# Patient Record
Sex: Female | Born: 1985 | Race: White | Hispanic: No | Marital: Single | Smoking: Current every day smoker
Health system: Southern US, Community
[De-identification: ages and names within clinical notes are randomized; demographics above are authoritative.]

## PROBLEM LIST (undated history)

## (undated) DIAGNOSIS — J45909 Unspecified asthma, uncomplicated: Secondary | ICD-10-CM

## (undated) HISTORY — PX: CHOLECYSTECTOMY: SHX55

## (undated) HISTORY — PX: HERNIA REPAIR: SHX51

---
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
## 2012-09-30 ENCOUNTER — Encounter (HOSPITAL_BASED_OUTPATIENT_CLINIC_OR_DEPARTMENT_OTHER): Payer: Self-pay | Admitting: *Deleted

## 2012-09-30 ENCOUNTER — Emergency Department (HOSPITAL_BASED_OUTPATIENT_CLINIC_OR_DEPARTMENT_OTHER)
Admission: EM | Admit: 2012-09-30 | Discharge: 2012-09-30 | Disposition: A | Discharge status: Home / Self Care | Payer: 59 | Attending: Emergency Medicine | Admitting: Emergency Medicine

## 2012-09-30 DIAGNOSIS — R059 Cough, unspecified: Secondary | ICD-10-CM | POA: Insufficient documentation

## 2012-09-30 DIAGNOSIS — R0602 Shortness of breath: Secondary | ICD-10-CM | POA: Insufficient documentation

## 2012-09-30 DIAGNOSIS — Z79899 Other long term (current) drug therapy: Secondary | ICD-10-CM | POA: Insufficient documentation

## 2012-09-30 DIAGNOSIS — J45901 Unspecified asthma with (acute) exacerbation: Secondary | ICD-10-CM | POA: Insufficient documentation

## 2012-09-30 DIAGNOSIS — R05 Cough: Secondary | ICD-10-CM | POA: Insufficient documentation

## 2012-09-30 DIAGNOSIS — F17200 Nicotine dependence, unspecified, uncomplicated: Secondary | ICD-10-CM | POA: Insufficient documentation

## 2012-09-30 HISTORY — DX: Unspecified asthma, uncomplicated: J45.909

## 2012-09-30 MED ORDER — IPRATROPIUM BROMIDE 0.02 % IN SOLN
0.5000 mg | Freq: Once | RESPIRATORY_TRACT | Status: AC
  Administered 2012-09-30: 0.5 mg via RESPIRATORY_TRACT
  Filled 2012-09-30: qty 2.5

## 2012-09-30 MED ORDER — ALBUTEROL SULFATE HFA 108 (90 BASE) MCG/ACT IN AERS: 1.0000 | INHALATION_SPRAY | RESPIRATORY_TRACT | Status: DC | PRN

## 2012-09-30 MED ORDER — PREDNISONE 50 MG PO TABS
60.0000 mg | ORAL_TABLET | Freq: Once | ORAL | Status: AC
  Administered 2012-09-30: 60 mg via ORAL
  Filled 2012-09-30: qty 1

## 2012-09-30 MED ORDER — GUAIFENESIN-CODEINE 100-10 MG/5ML PO SYRP: 10.0000 mL | ORAL_SOLUTION | Freq: Three times a day (TID) | ORAL | Status: DC | PRN

## 2012-09-30 MED ORDER — AZITHROMYCIN 250 MG PO TABS
500.0000 mg | ORAL_TABLET | Freq: Once | ORAL | Status: AC
  Administered 2012-09-30: 500 mg via ORAL
  Filled 2012-09-30: qty 2

## 2012-09-30 MED ORDER — PREDNISONE 20 MG PO TABS: ORAL_TABLET | ORAL | Status: DC

## 2012-09-30 MED ORDER — GUAIFENESIN-CODEINE 100-10 MG/5ML PO SOLN
10.0000 mL | Freq: Once | ORAL | Status: AC
  Administered 2012-09-30: 10 mL via ORAL
  Filled 2012-09-30: qty 10

## 2012-09-30 MED ORDER — AZITHROMYCIN 250 MG PO TABS: 250.0000 mg | ORAL_TABLET | Freq: Every day | ORAL | Status: DC

## 2012-09-30 MED ORDER — ALBUTEROL SULFATE (5 MG/ML) 0.5% IN NEBU
5.0000 mg | INHALATION_SOLUTION | Freq: Once | RESPIRATORY_TRACT | Status: AC
  Administered 2012-09-30: 5 mg via RESPIRATORY_TRACT
  Filled 2012-09-30: qty 1

## 2012-09-30 NOTE — ED Provider Notes (Signed)
History     CSN: [id number]  Arrival date & time 09/30/12  1749   First MD Initiated Contact with Patient 09/30/12 1906      Chief Complaint  Patient presents with  . Chest Pain    (Consider location/radiation/quality/duration/timing/severity/associated sxs/prior treatment) Patient is a 27 y.o. female presenting with cough. The history is provided by the patient. No language interpreter was used.  Cough Cough characteristics:  Non-productive Severity:  Moderate Onset quality:  Gradual Duration:  2 days Timing: Frequent hacking cough, associated with wheezing. Progression:  Worsening Chronicity:  New Smoker: yes   Context comment:  Hx asthma, is a smoker. Relieved by:  Nothing Worsened by:  Nothing tried Ineffective treatments:  Beta-agonist inhaler Associated symptoms: chest pain, shortness of breath and wheezing   Associated symptoms: no chills and no fever     Past Medical History  Diagnosis Date  . Asthma     Past Surgical History  Procedure Laterality Date  . Cholecystectomy    . Hernia repair      No family history on file.  History  Substance Use Topics  . Smoking status: Current Every Day Smoker -- 0.50 packs/day    Types: Cigarettes  . Smokeless tobacco: Not on file  . Alcohol Use: No    OB History   Grav Para Term Preterm Abortions TAB SAB Ect Mult Living                  Review of Systems  Constitutional: Negative for fever and chills.  HENT: Negative.   Respiratory: Positive for cough, shortness of breath and wheezing.   Cardiovascular: Positive for chest pain.  Gastrointestinal: Negative.   Genitourinary:       Having menses now.  Musculoskeletal: Negative.   Skin: Negative.   Neurological: Negative.   Psychiatric/Behavioral: Negative.     Allergies  Review of patient's allergies indicates no known allergies.  Home Medications   Current Outpatient Rx  Name  Route  Sig  Dispense  Refill  . ALBUTEROL IN   Inhalation   Inhale  into the lungs.           BP 128/86  Pulse 110  Temp(Src) 98 F (36.7 C) (Oral)  Resp 22  Wt 210 lb (95.255 kg)  SpO2 97%  Physical Exam  Constitutional: She is oriented to person, place, and time. She appears well-developed and well-nourished. No distress.  HENT:  Head: Normocephalic and atraumatic.  Right Ear: External ear normal.  Left Ear: External ear normal.  Mouth/Throat: Oropharynx is clear and moist.  Eyes: Conjunctivae and EOM are normal. Pupils are equal, round, and reactive to light.  Neck: Normal range of motion. Neck supple.  Cardiovascular: Normal rate, regular rhythm and normal heart sounds.   Pulmonary/Chest: Effort normal. Wheezes: Wheezes heard over anterior lung fields.  Abdominal: Soft. Bowel sounds are normal.  Musculoskeletal: Normal range of motion. She exhibits no edema and no tenderness.  Neurological: She is alert and oriented to person, place, and time.  No sensory or motor deficit.  Skin: Skin is warm and dry.  Psychiatric: She has a normal mood and affect. Her behavior is normal.    ED Course  Procedures (including critical care time)  Rx for asthmatic bronchitis with azithromycin, prednisone taper, Robitussin AC, and continued use of albuterol inhaler.  No work for 3 days.    1. Asthmatic bronchitis            Carleene Cooper III, MD 10/01/12 [phone]

## 2012-09-30 NOTE — ED Notes (Signed)
Pt states that she "thinks she might have had an asthma attack this morning". She used her Albuterol inhaler this morning and states she used "40 puffs".

## 2012-09-30 NOTE — ED Provider Notes (Signed)
6:00 PM  Date: 09/30/2012  Rate: 109  Rhythm: sinus tachycardia  QRS Axis: right  Intervals: normal PQRS:  Right atrial enlargement.  Poor R wave progression in precordial leads suggests possible old anterior myocardial infarction.  ST/T Wave abnormalities: normal  Conduction Disutrbances:none  Narrative Interpretation: Abnormal EKG  Old EKG Reviewed: none available    Carleene Cooper III, MD 09/30/12 [phone]

## 2012-09-30 NOTE — ED Notes (Signed)
MD at bedside. 

## 2012-09-30 NOTE — ED Notes (Signed)
Chest pressure in her chest x 2 days. States she has asthma. Sob.

## 2013-06-01 ENCOUNTER — Emergency Department (HOSPITAL_COMMUNITY)
Admission: EM | Admit: 2013-06-01 | Discharge: 2013-06-01 | Disposition: A | Discharge status: Home / Self Care | Payer: Self-pay | Attending: Emergency Medicine | Admitting: Emergency Medicine

## 2013-06-01 ENCOUNTER — Emergency Department (HOSPITAL_COMMUNITY): Payer: Self-pay

## 2013-06-01 ENCOUNTER — Encounter (HOSPITAL_COMMUNITY): Payer: Self-pay | Admitting: Emergency Medicine

## 2013-06-01 DIAGNOSIS — J069 Acute upper respiratory infection, unspecified: Secondary | ICD-10-CM | POA: Insufficient documentation

## 2013-06-01 DIAGNOSIS — H9190 Unspecified hearing loss, unspecified ear: Secondary | ICD-10-CM | POA: Insufficient documentation

## 2013-06-01 DIAGNOSIS — F17200 Nicotine dependence, unspecified, uncomplicated: Secondary | ICD-10-CM | POA: Insufficient documentation

## 2013-06-01 DIAGNOSIS — Z79899 Other long term (current) drug therapy: Secondary | ICD-10-CM | POA: Insufficient documentation

## 2013-06-01 DIAGNOSIS — J45901 Unspecified asthma with (acute) exacerbation: Secondary | ICD-10-CM | POA: Insufficient documentation

## 2013-06-01 DIAGNOSIS — R0602 Shortness of breath: Secondary | ICD-10-CM | POA: Insufficient documentation

## 2013-06-01 MED ORDER — PREDNISONE 20 MG PO TABS
60.0000 mg | ORAL_TABLET | Freq: Once | ORAL | Status: AC
  Administered 2013-06-01: 60 mg via ORAL
  Filled 2013-06-01: qty 3

## 2013-06-01 MED ORDER — ALBUTEROL SULFATE HFA 108 (90 BASE) MCG/ACT IN AERS: 2.0000 | INHALATION_SPRAY | RESPIRATORY_TRACT | Status: DC | PRN

## 2013-06-01 MED ORDER — ALBUTEROL SULFATE HFA 108 (90 BASE) MCG/ACT IN AERS
4.0000 | INHALATION_SPRAY | Freq: Once | RESPIRATORY_TRACT | Status: AC
  Administered 2013-06-01: 4 via RESPIRATORY_TRACT
  Filled 2013-06-01: qty 6.7

## 2013-06-01 MED ORDER — PREDNISONE 20 MG PO TABS: ORAL_TABLET | ORAL | Status: DC

## 2013-06-01 NOTE — ED Notes (Signed)
PT with hx of asthma to ED c/o sob and R ear pain.  States her inhaler is no longer working.

## 2013-06-01 NOTE — ED Provider Notes (Signed)
CSN: [id number]     Arrival date & time 06/01/13  1111 History   First MD Initiated Contact with Patient 06/01/13 1148     Chief Complaint  Patient presents with  . Otalgia  . Shortness of Breath   (Consider location/radiation/quality/duration/timing/severity/associated sxs/prior Treatment) HPI Comments: 27 year old female presents with one week of feeling like her right ear is clogged. She states it feels full she has mildly decreased hearing to that side. It seems to wax and wane and this will be better with it seems to pop when she opens her jaw like with yawning. She's also been having some sinus congestion during this time. No fevers or chills. She has a chronic cough from her asthma and smoking does not worse. She has been having worse than normal wheezing over the past 3 or 4 days. She ran out of her inhaler 4 days ago. She states she uses her inhaler almost daily. She has had some shortness of breath with exertion but is feeling normal at rest.   Past Medical History  Diagnosis Date  . Asthma    Past Surgical History  Procedure Laterality Date  . Cholecystectomy    . Hernia repair     No family history on file. History  Substance Use Topics  . Smoking status: Current Every Day Smoker -- 0.15 packs/day    Types: Cigarettes  . Smokeless tobacco: Not on file  . Alcohol Use: No   OB History   Grav Para Term Preterm Abortions TAB SAB Ect Mult Living                 Review of Systems  Constitutional: Negative for fever and chills.  HENT: Positive for congestion and ear pain. Negative for ear discharge and sore throat.   Respiratory: Positive for cough, shortness of breath and wheezing.   Cardiovascular: Negative for chest pain.  Genitourinary: Negative for dysuria.    Allergies  Review of patient's allergies indicates no known allergies.  Home Medications   Current Outpatient Rx  Name  Route  Sig  Dispense  Refill  . albuterol (PROVENTIL HFA;VENTOLIN HFA) 108 (90  BASE) MCG/ACT inhaler   Inhalation   Inhale 1-2 puffs into the lungs every 4 (four) hours as needed for wheezing.   1 Inhaler   0   . albuterol (PROVENTIL HFA;VENTOLIN HFA) 108 (90 BASE) MCG/ACT inhaler   Inhalation   Inhale 2 puffs into the lungs every 4 (four) hours as needed for wheezing or shortness of breath.   1 Inhaler   0   . ALBUTEROL IN   Inhalation   Inhale into the lungs.         Marland Kitchen azithromycin (ZITHROMAX Z-PAK) 250 MG tablet   Oral   Take 1 tablet (250 mg total) by mouth daily.   4 tablet   0   . guaiFENesin-codeine (ROBITUSSIN AC) 100-10 MG/5ML syrup   Oral   Take 10 mLs by mouth 3 (three) times daily as needed for cough (Take 2 teaspoons every 4 hours if needed for cough.).   120 mL   0   . predniSONE (DELTASONE) 20 MG tablet      Take 3 tablets tomorrow, then take 2 tablets the next 2 days, then take 1 tablet the next 2 days.   9 tablet   0   . predniSONE (DELTASONE) 20 MG tablet      2 tabs po daily x 4 days   8 tablet   0  BP 142/86  Pulse 83  Temp(Src) 97.9 F (36.6 C) (Oral)  Resp 20  SpO2 100%  LMP 05/14/2013 Physical Exam  Nursing note and vitals reviewed. Constitutional: She is oriented to person, place, and time. She appears well-developed and well-nourished.  HENT:  Head: Normocephalic and atraumatic.  Right Ear: Tympanic membrane, external ear and ear canal normal.  Left Ear: Tympanic membrane, external ear and ear canal normal.  Nose: Nose normal.  Mouth/Throat: Oropharynx is clear and moist.  Eyes: Right eye exhibits no discharge. Left eye exhibits no discharge.  Cardiovascular: Normal rate, regular rhythm and normal heart sounds.   Pulmonary/Chest: Effort normal. Not tachypneic. She has no decreased breath sounds. She has wheezes.  Abdominal: Soft. There is no tenderness.  Neurological: She is alert and oriented to person, place, and time.  Skin: Skin is warm and dry.    ED Course  Procedures (including critical care  time) Labs Review Labs Reviewed - No data to display Imaging Review Dg Chest 2 View (if Patient Has Fever And/or Copd)  06/01/2013   CLINICAL DATA:  Short of breath.  Cough.  Smoker.  Asthma.  EXAM: CHEST  2 VIEW  COMPARISON:  02/18/2013  FINDINGS: Midline trachea. Normal heart size and mediastinal contours. No pleural effusion or pneumothorax. Diffuse peribronchial thickening. No lobar consolidation. Probable cholecystectomy.  IMPRESSION: 1.  No acute cardiopulmonary disease. 2. Mild peribronchial thickening which may relate to chronic bronchitis or smoking.   Electronically Signed   By: Jeronimo Greaves M.D.   On: 06/01/2013 11:48    EKG Interpretation   None       MDM   1. Upper respiratory infection   2. Asthma exacerbation    Patient symptoms are consistent with a viral URI. These are likely exacerbating her chronic asthma. I counseled her on stopping smoking. Will give her an inhaler here and they refill as an outpatient. She is in no respiratory distress and has a normal chest x-ray. The ear is likely related to the congestion as there is no sign of a otitis media or externa. We'll also give her a burst of steroids. At this point patient appears well and has no indications for further treatment or assessment in the ER has been given strict return precautions.    Audree Camel, MD 06/01/13 1300

## 2014-02-24 ENCOUNTER — Encounter (HOSPITAL_COMMUNITY): Payer: Self-pay | Admitting: Emergency Medicine

## 2014-02-24 ENCOUNTER — Emergency Department (HOSPITAL_COMMUNITY): Payer: 59

## 2014-02-24 ENCOUNTER — Emergency Department (HOSPITAL_COMMUNITY)
Admission: EM | Admit: 2014-02-24 | Discharge: 2014-02-24 | Disposition: A | Discharge status: Home / Self Care | Payer: 59 | Attending: Emergency Medicine | Admitting: Emergency Medicine

## 2014-02-24 DIAGNOSIS — R05 Cough: Secondary | ICD-10-CM | POA: Insufficient documentation

## 2014-02-24 DIAGNOSIS — R059 Cough, unspecified: Secondary | ICD-10-CM | POA: Insufficient documentation

## 2014-02-24 DIAGNOSIS — F17200 Nicotine dependence, unspecified, uncomplicated: Secondary | ICD-10-CM | POA: Insufficient documentation

## 2014-02-24 DIAGNOSIS — J45901 Unspecified asthma with (acute) exacerbation: Secondary | ICD-10-CM

## 2014-02-24 MED ORDER — ALBUTEROL SULFATE HFA 108 (90 BASE) MCG/ACT IN AERS
2.0000 | INHALATION_SPRAY | RESPIRATORY_TRACT | Status: DC | PRN
  Filled 2014-02-24: qty 6.7

## 2014-02-24 MED ORDER — IPRATROPIUM-ALBUTEROL 0.5-2.5 (3) MG/3ML IN SOLN
3.0000 mL | Freq: Once | RESPIRATORY_TRACT | Status: AC
  Administered 2014-02-24: 3 mL via RESPIRATORY_TRACT

## 2014-02-24 MED ORDER — ALBUTEROL SULFATE HFA 108 (90 BASE) MCG/ACT IN AERS: 2.0000 | INHALATION_SPRAY | RESPIRATORY_TRACT | Status: DC | PRN

## 2014-02-24 MED ORDER — IPRATROPIUM-ALBUTEROL 0.5-2.5 (3) MG/3ML IN SOLN
3.0000 mL | Freq: Once | RESPIRATORY_TRACT | Status: AC
Start: 2014-02-24 — End: 2014-02-24
  Administered 2014-02-24: 3 mL via RESPIRATORY_TRACT
  Filled 2014-02-24: qty 3

## 2014-02-24 MED ORDER — PREDNISONE 20 MG PO TABS
60.0000 mg | ORAL_TABLET | Freq: Once | ORAL | Status: AC
  Administered 2014-02-24: 60 mg via ORAL
  Filled 2014-02-24: qty 3

## 2014-02-24 MED ORDER — PREDNISONE 20 MG PO TABS: 60.0000 mg | ORAL_TABLET | Freq: Every day | ORAL | Status: DC

## 2014-02-24 NOTE — ED Notes (Signed)
Pt ambulated down hall with no assistance, O2 sats ranged from 97-99% RA.

## 2014-02-24 NOTE — ED Notes (Signed)
Pt presents with a productive cough and wheezing x1 week. Pt states she had similar symptoms a month ago and diagnosed with an URI. Some expiratory wheezing noted in triage

## 2014-02-24 NOTE — Discharge Instructions (Signed)

## 2014-02-24 NOTE — ED Provider Notes (Signed)
CSN: [id number]     Arrival date & time 02/24/14  0200 History   First MD Initiated Contact with Patient 02/24/14 0247     Chief Complaint  Patient presents with  . Cough  . Wheezing     (Consider location/radiation/quality/duration/timing/severity/associated sxs/prior Treatment) HPI  This is a 28 year old female with a history of asthma who presents with cough and wheezing.  Patient reports one week of symptoms. She denies any fever or productive cough. She does not have an inhaler. Last exacerbation was approximately 2 months ago. She feels this is consistent with her asthma. She denies any chest pain or other symptoms.  Past Medical History  Diagnosis Date  . Asthma    Past Surgical History  Procedure Laterality Date  . Cholecystectomy    . Hernia repair     History reviewed. No pertinent family history. History  Substance Use Topics  . Smoking status: Current Every Day Smoker -- 0.15 packs/day    Types: Cigarettes  . Smokeless tobacco: Not on file  . Alcohol Use: No   OB History   Grav Para Term Preterm Abortions TAB SAB Ect Mult Living                 Review of Systems  Constitutional: Negative for fever.  Respiratory: Positive for cough, shortness of breath and wheezing.   Cardiovascular: Negative for chest pain.  Genitourinary: Negative for dysuria.  All other systems reviewed and are negative.     Allergies  Review of patient's allergies indicates no known allergies.  Home Medications   Prior to Admission medications   Medication Sig Start Date End Date Taking? Authorizing Provider  albuterol (PROVENTIL HFA;VENTOLIN HFA) 108 (90 BASE) MCG/ACT inhaler Inhale 2 puffs into the lungs every 4 (four) hours as needed for wheezing or shortness of breath. 02/24/14   Shon Baton, MD  predniSONE (DELTASONE) 20 MG tablet Take 3 tablets (60 mg total) by mouth daily with breakfast. 02/24/14   Shon Baton, MD   BP 131/92  Pulse 93  Temp(Src) 98.1 F (36.7  C) (Oral)  Resp 20  Ht 5\' 6"  (1.676 m)  Wt 232 lb (105.235 kg)  BMI 37.46 kg/m2  SpO2 97%  LMP 02/24/2014 Physical Exam  Nursing note and vitals reviewed. Constitutional: She is oriented to person, place, and time. She appears well-developed and well-nourished. No distress.  HENT:  Head: Normocephalic and atraumatic.  Cardiovascular: Normal rate, regular rhythm and normal heart sounds.   Pulmonary/Chest: Effort normal. No respiratory distress. She has wheezes.  Expiratory wheezing noted diffusely  Neurological: She is alert and oriented to person, place, and time.  Skin: Skin is warm and dry.  Psychiatric: She has a normal mood and affect.    ED Course  Procedures (including critical care time) Labs Review Labs Reviewed - No data to display  Imaging Review Dg Chest 2 View  02/24/2014   CLINICAL DATA:  Cough and wheezing for 1 week.  History of asthma.  EXAM: CHEST  2 VIEW  COMPARISON:  Chest radiograph performed 01/17/2014  FINDINGS: The lungs are well-aerated and clear. There is no evidence of focal opacification, pleural effusion or pneumothorax.  The heart is normal in size; the mediastinal contour is within normal limits. No acute osseous abnormalities are seen. Clips are noted within the right upper quadrant, reflecting prior cholecystectomy.  IMPRESSION: No acute cardiopulmonary process seen.   Electronically Signed   By: Roanna Raider M.D.   On: 02/24/2014 03:14  EKG Interpretation None      MDM   Final diagnoses:  Asthma exacerbation    Patient presents with cough and wheezing consistent with prior asthma attacks. She is nontoxic on exam. No respiratory distress and speaking in full sentences. She's received 1 duo neb and reports improvement of her symptoms. Will order a second neb given continued wheezing and give the patient prednisone. HFA inhaler was also ordered for the patient. After second neb, patient continues to report improvement. She continues to have  scant expiratory wheezing on exam. However, she ambulated and maintain her pulse ox greater than 96%. Chest x-ray sent from triage and reviewed. Patient will be discharged home with steroids and inhaler.  After history, exam, and medical workup I feel the patient has been appropriately medically screened and is safe for discharge home. Pertinent diagnoses were discussed with the patient. Patient was given return precautions.    Shon Batonourtney F Jamya Starry, MD 02/24/14 [phone]

## 2014-02-24 NOTE — ED Notes (Signed)
Discharge instructions reviewed with pt. Pt verbalized understanding.   

## 2014-05-17 ENCOUNTER — Emergency Department (HOSPITAL_COMMUNITY): Payer: PRIVATE HEALTH INSURANCE

## 2014-05-17 ENCOUNTER — Emergency Department (HOSPITAL_COMMUNITY)
Admission: EM | Admit: 2014-05-17 | Discharge: 2014-05-17 | Disposition: A | Discharge status: Home / Self Care | Payer: PRIVATE HEALTH INSURANCE | Attending: Emergency Medicine | Admitting: Emergency Medicine

## 2014-05-17 ENCOUNTER — Encounter (HOSPITAL_COMMUNITY): Payer: Self-pay | Admitting: Emergency Medicine

## 2014-05-17 DIAGNOSIS — Z79899 Other long term (current) drug therapy: Secondary | ICD-10-CM | POA: Insufficient documentation

## 2014-05-17 DIAGNOSIS — R05 Cough: Secondary | ICD-10-CM

## 2014-05-17 DIAGNOSIS — J069 Acute upper respiratory infection, unspecified: Secondary | ICD-10-CM | POA: Insufficient documentation

## 2014-05-17 DIAGNOSIS — Z720 Tobacco use: Secondary | ICD-10-CM | POA: Insufficient documentation

## 2014-05-17 DIAGNOSIS — Z7952 Long term (current) use of systemic steroids: Secondary | ICD-10-CM | POA: Diagnosis not present

## 2014-05-17 DIAGNOSIS — J45901 Unspecified asthma with (acute) exacerbation: Secondary | ICD-10-CM

## 2014-05-17 DIAGNOSIS — R059 Cough, unspecified: Secondary | ICD-10-CM

## 2014-05-17 MED ORDER — PREDNISONE 20 MG PO TABS: 40.0000 mg | ORAL_TABLET | Freq: Every day | ORAL | Status: DC

## 2014-05-17 MED ORDER — ALBUTEROL SULFATE (2.5 MG/3ML) 0.083% IN NEBU
2.5000 mg | INHALATION_SOLUTION | Freq: Once | RESPIRATORY_TRACT | Status: AC
  Administered 2014-05-17: 2.5 mg via RESPIRATORY_TRACT
  Filled 2014-05-17: qty 3

## 2014-05-17 MED ORDER — ALBUTEROL SULFATE HFA 108 (90 BASE) MCG/ACT IN AERS: 2.0000 | INHALATION_SPRAY | RESPIRATORY_TRACT | Status: DC | PRN

## 2014-05-17 MED ORDER — ALBUTEROL SULFATE (2.5 MG/3ML) 0.083% IN NEBU: 2.5000 mg | INHALATION_SOLUTION | RESPIRATORY_TRACT | Status: DC | PRN

## 2014-05-17 MED ORDER — PREDNISONE 20 MG PO TABS
60.0000 mg | ORAL_TABLET | Freq: Once | ORAL | Status: AC
  Administered 2014-05-17: 60 mg via ORAL
  Filled 2014-05-17: qty 3

## 2014-05-17 MED ORDER — IPRATROPIUM-ALBUTEROL 0.5-2.5 (3) MG/3ML IN SOLN
3.0000 mL | Freq: Once | RESPIRATORY_TRACT | Status: AC
Start: 2014-05-17 — End: 2014-05-17
  Administered 2014-05-17: 3 mL via RESPIRATORY_TRACT
  Filled 2014-05-17: qty 3

## 2014-05-17 NOTE — Discharge Instructions (Signed)
°Emergency Department Resource Guide °1) Find a Doctor and Pay Out of Pocket °Although you won't have to find out who is covered by your insurance plan, it is a good idea to ask around and get recommendations. You will then need to call the office and see if the doctor you have chosen will accept you as a new patient and what types of options they offer for patients who are self-pay. Some doctors offer discounts or will set up payment plans for their patients who do not have insurance, but you will need to ask so you aren't surprised when you get to your appointment. ° °2) Contact Your Local Health Department °Not all health departments have doctors that can see patients for sick visits, but many do, so it is worth a call to see if yours does. If you don't know where your local health department is, you can check in your phone book. The CDC also has a tool to help you locate your state's health department, and many state websites also have listings of all of their local health departments. ° °3) Find a Walk-in Clinic °If your illness is not likely to be very severe or complicated, you may want to try a walk in clinic. These are popping up all over the country in pharmacies, drugstores, and shopping centers. They're usually staffed by nurse practitioners or physician assistants that have been trained to treat common illnesses and complaints. They're usually fairly quick and inexpensive. However, if you have serious medical issues or chronic medical problems, these are probably not your best option. ° °No Primary Care Doctor: °- Call [facility] at  832-8000 - they can help you locate a primary care doctor that  accepts your insurance, provides certain services, etc. °- Physician Referral Service- 1-800-533-3463 ° °Chronic Pain Problems: °Organization         Address  Phone   Notes  °[facility]  (336) 297-2271 Patients need to be referred by their primary care doctor.  ° °Medication  Assistance: °Organization         Address  Phone   Notes  °[place] [facility] [address] °[place], [place] [address] (336) 641-8030 --Must be a resident of [place] °-- Must have NO insurance coverage whatsoever (no Medicaid/ Medicare, etc.) °-- The pt. MUST have a primary care doctor that directs their care regularly and follows them in the community °  °[facility]  (866) 331-1348   °[employer]  (888) 892-1162   ° °Agencies that provide inexpensive medical care: °Organization         Address  Phone   Notes  °[facility]  (336) 832-8035   °[facility]    (336) 832-7272   °[facility] [address] °[place], [place] [address] (336) 832-4777   °[facility] [address], °[place] (336) 271-4999   °[facility]    (336) 373-0678   °[facility]    (336) 272-1050   °[facility] ° [address], [place] Phone:  (336) 832-4444, Fax:  (336) 832-4440 Hours of Operation:  9 am - 6 pm, M-F.  Also accepts Medicaid/Medicare and self-pay.  °[facility] ° [address], [place] Phone: (336) 832-3150, Fax: (336) [phone]. Hours of Operation:  8:30 am - 5:30 pm, M-F.  Also accepts Medicaid and self-pay.  °[facility] [place] [address], [place] Phone: (336) 878-6027   °[facility] [address], [place], [place] (336)723-1848, Ext. 123 Mondays & Thursdays: 7-9 AM.  First 15 patients are seen on a first come, first serve basis. °  ° °Medicaid-accepting [place] Providers: ° °Organization         Address  Phone   Notes  °[facility] [address], [place] (336) 641-2100 Also accepts self-pay patients.  °[facility] [address], [place] ° (336) 856-9996   °[facility] [address], [place]  (336) 288-8857   °[facility] [address] [place] [address], [place] (336) 299-7000   °Veita Bland [address], [place]  ° (336) 373-1557 Only accepts [place] Access Medicaid patients after they have their name applied to their card.  ° °Self-Pay (no insurance) in [place]: ° °Organization         Address  Phone   Notes  °Sickle Cell Patients, [facility] [address], [place] (336) 832-1970   °[facility] [address], [place] (336) 832-4400   °[facility] ° [address], [place] (336) 992-4800   °[facility]/Dr. Osei-Bonsu ° [address] [place] [address], [place] or [address], [place] (336) 841-8500 Phone number for both [place] and [place] locations is the same.  °[facility] [address], [place] (336) 299-0000   °[facility] [address] [place] [address], [place] or [address] (336) 852-7530 °(336) 878-2260   °[facility] [address], [place] (336) 350-1642, phone; (336) 294-5005, fax Sees patients 1st and 3rd Saturday of every month.  Must not qualify for public or private insurance (i.e. Medicaid, Medicare, [place] Health Choice, Veterans' Benefits) • Household income should be no more than 200% of the poverty level •The clinic cannot treat you if you are pregnant or think you are pregnant • Sexually transmitted diseases are not treated at the clinic.  ° ° °Dental Care: °Organization         Address  Phone  Notes  °[place] [facility] [address], [place] (336) 641-6152 Accepts children up to age 21 who are enrolled in Medicaid or [place] Health Choice; pregnant women with a Medicaid card; and children who have applied for Medicaid or [place] Health Choice, but were declined, whose parents can pay a reduced fee at time of service.  °[place]  [facility] [place]  [address], [place] (336) 641-7733 Accepts children up to age 21 who are enrolled in Medicaid or [place] Health Choice; pregnant women with a Medicaid card; and children who have applied for Medicaid or [place] Health Choice, but were declined, whose parents can pay a reduced fee at time of service.  °[facility] ° [address], [place] (336) 641-4533 Patients are seen by appointment only. Walk-ins are not accepted. [facility] will see patients 18 years of age and older. °Monday - Tuesday (8am-5pm) °Most Wednesdays (8:30-5pm) °$30 per visit, cash only  °[facility] ° [address], [place] (336) 641-4533 Patients are seen by appointment only. Walk-ins are not accepted. [facility] will see patients 18 years of age and older. °One   Wednesday Evening (Monthly: Volunteer Based).  $30 per visit, cash only  °[facility]  (919) 537-3737 for adults; Children under age 4, call Graduate Pediatric Dentistry at ([phone]) [phone]. Children aged 4-14, please call (919) [phone] to request a pediatric application. ° Dental services are provided in all areas of dental care including fillings, crowns and bridges, complete and partial dentures, implants, gum treatment, root canals, and extractions. Preventive care is also provided. Treatment is provided to both adults and children. °Patients are selected via a lottery and there is often a waiting list. °  °[facility] [address], °[place] ° (336) 763-8833 [email or web address] °  °[facility] [address], [place], [place] (336)723-1848, Ext. 123 Second and Fourth Thursday of each month, opens at 6:30 AM; Clinic ends at 9 AM.  Patients are seen on a first-come first-served basis, and a limited number are seen during each clinic.  ° °[facility] ° [address], [place], [place] (336) 723-7904    Eligibility Requirements °You must have lived in [place], [place], or [place] counties for at least the last three months. °  You cannot be eligible for state or federal sponsored healthcare insurance, including Veterans Administration, Medicaid, or Medicare. °  You generally cannot be eligible for healthcare insurance through your employer.  °  How to apply: °Eligibility screenings are held every Tuesday and Wednesday afternoon from 1:00 pm until 4:00 pm. You do not need an appointment for the interview!  °[facility] [address], [place], [place] 336-631-2330   °[facility]  336-342-8273   °[facility]  336-703-3100   °[facility]  336-570-6415   ° °Behavioral Health Resources in the Community: °Intensive Outpatient Programs °Organization         Address  Phone  Notes  °[facility] [address], [place], [place] 336-878-6098   °[facility] [address], [place], [place] 336-832-9800   °[facility] [address], [place], [place] ° 336-882-2125   °[facility] [address],  °[place], [place] 1-800-853-5163 or 336-641-4981   °Substance Abuse Resources °Organization         Address  Phone  Notes  °[facility]  336-882-2125   °[facility]  336-784-9470   °[facility]  336-285-9073   °[facility]  336-845-3988   °Residential & Outpatient Substance Abuse Program  1-800-659-3381   °Psychological Services °Organization         Address  Phone  Notes  °[facility]  336- 832-9600   °[facility]  336- 378-7881   °[facility] [address], [place] 1-800-853-5163 or 336-641-4981   ° °Mobile Crisis Teams °Organization         Address  Phone  Notes  °[facility], Mobile Crisis Care Unit  1-877-626-1772   °Assertive °[facility] ° [address]  [place], [place] 336-834-9664   °Sharon DeEsch [address] °[place] 336-554-5454   ° °Self-Help/Support Groups °Organization         Address  Phone             Notes  °[facility] of  - variety of support groups  336- 373-1402 Call for more information  °Narcotics Anonymous (NA), [facility] [address], °[place]  2 meetings at this location  ° °  Residential Treatment Programs Organization         Address  Phone  Notes  [facility] [address],    [place]  [phone]   [facility]  [address], [place] [phone], [place], [place] [phone]   [facility] [address], [place] [phone] Admissions: 8am-3pm M-F  [facility] [address]    [place], [place] [phone]   [facility] [address], [place], [place] [phone]   [facility] [address]  [place], [place] [phone]   [facility] Intensive Outpatient [address], [place], [place] [phone]   [facility] ([facility] [address]  [place], [place] [phone] or [phone])[phone]   [facility] ([facility]) [address] [place], [place] [phone] Accepts Medicaid  [facility] [address]  [place] [phone] Substance Abuse/Addiction Treatment   [facility]         Address  Phone  Notes  [facility]  724-068-4354(888) ([phone]) [phone]   Angie FavaJulie Brannon, PhD [address], [address] [place], [place]   [phone](336) [phone] or [phone](336) [phone]   [facility]   [address] [place], [place] [phone](336) [phone]   [facility] [address], [place], [place] [phone](336) [phone] Insurance/Medicaid/sponsorship through [facility]  [facility] [address]                                    [place], [place] [phone]) [phone] Therapy/tele-psych/case    [facility] [address]   [place], [place] [phone]) [phone]    Dr. Lolly MustacheArfeen  [phone](336) [phone]   [facility]  [employer] [facility] 1) [address], [place] 2) [address], [place] 3)  [address], [place] 269-819-6527(336) [phone] [phone](336) [phone]  [phone](336) [phone]   [facility] Child Abuse Hotline 702-669-7382(336) 661-613-8312 or [phone](336) [phone] (After Hours)      Take over the counter decongestant (such as sudafed), as directed on packaging, for the next week.  Use over the counter normal saline nasal spray, as instructed in the Emergency Department, several times per day for the next 2 weeks.  Take the prescriptions as directed.  Use your albuterol inhaler (2 to 4 puffs) or your albuterol nebulizer (1 unit dose) every 4 hours for the next 7 days, then as needed for cough, wheezing, or shortness of breath.  Call your regular medical doctor this morning to schedule a follow up appointment within the next 3 days.  Return to the Emergency Department immediately sooner if worsening.

## 2014-05-17 NOTE — ED Provider Notes (Signed)
CSN: [id number]     Arrival date & time 05/17/14  [id number] History   First MD Initiated Contact with Patient 05/17/14 [phone]     Chief Complaint  Patient presents with  . Asthma  . Cough      HPI Pt was seen at 0720.  Per pt, c/o gradual onset and persistence of constant runny/stuffy nose, sinus congestion, and cough for the past 2-3 days. Has been associated with "wheezing." Describes the wheezing as "it's my asthma." Pt has been using her albuterol neb and MDI with partial relief. States she has run out of these medications and is requesting a refill. Pt states multiple other family members have the same symptoms.  Denies fevers, no rash, no CP/SOB, no N/V/D, no abd pain, no sore throat.     Past Medical History  Diagnosis Date  . Asthma    Past Surgical History  Procedure Laterality Date  . Cholecystectomy    . Hernia repair     Family History  Problem Relation Age of Onset  . Diabetes Father   . Hypertension Father    History  Substance Use Topics  . Smoking status: Current Every Day Smoker -- 0.15 packs/day    Types: Cigarettes  . Smokeless tobacco: Not on file  . Alcohol Use: No    Review of Systems ROS: Statement: All systems negative except as marked or noted in the HPI; Constitutional: Negative for fever and chills. ; ; Eyes: Negative for eye pain, redness and discharge. ; ; ENMT: Negative for ear pain, hoarseness, sore throat. +nasal congestion, sinus pressure and rhinorrhea.; ; Cardiovascular: Negative for chest pain, palpitations, diaphoresis, dyspnea and peripheral edema. ; ; Respiratory: +cough, wheezing. Negative for stridor. ; ; Gastrointestinal: Negative for nausea, vomiting, diarrhea, abdominal pain, blood in stool, hematemesis, jaundice and rectal bleeding. . ; ; Genitourinary: Negative for dysuria, flank pain and hematuria. ; ; Musculoskeletal: Negative for back pain and neck pain. Negative for swelling and trauma.; ; Skin: Negative for pruritus, rash, abrasions,  blisters, bruising and skin lesion.; ; Neuro: Negative for headache, lightheadedness and neck stiffness. Negative for weakness, altered level of consciousness , altered mental status, extremity weakness, paresthesias, involuntary movement, seizure and syncope.      Allergies  Review of patient's allergies indicates no known allergies.  Home Medications   Prior to Admission medications   Medication Sig Start Date End Date Taking? Authorizing Provider  albuterol (PROVENTIL HFA;VENTOLIN HFA) 108 (90 BASE) MCG/ACT inhaler Inhale 2 puffs into the lungs every 4 (four) hours as needed for wheezing or shortness of breath. 02/24/14   Shon Batonourtney F Horton, MD  predniSONE (DELTASONE) 20 MG tablet Take 3 tablets (60 mg total) by mouth daily with breakfast. 02/24/14   Shon Batonourtney F Horton, MD   BP 135/89  Pulse 90  Temp(Src) 98.2 F (36.8 C) (Oral)  Resp 20  SpO2 95%  LMP 05/10/2014 Physical Exam 0725: Physical examination:  Nursing notes reviewed; Vital signs and O2 SAT reviewed;  Constitutional: Well developed, Well nourished, Well hydrated, In no acute distress; Head:  Normocephalic, atraumatic; Eyes: EOMI, PERRL, No scleral icterus; ENMT: TM's clear bilat. +edemetous nasal turbinates bilat with clear rhinorrhea. Mouth and pharynx normal, Mucous membranes moist; Neck: Supple, Full range of motion, No lymphadenopathy; Cardiovascular: Regular rate and rhythm, No gallop; Respiratory: Breath sounds coarse & equal bilaterally, faint scattered exp wheezes. No audible wheezing. Speaking full sentences with ease, Normal respiratory effort/excursion; Chest: Nontender, Movement normal; Abdomen: Soft, Nontender, Nondistended, Normal bowel sounds; Genitourinary:  No CVA tenderness; Extremities: Pulses normal, No tenderness, No edema, No calf edema or asymmetry.; Neuro: AA&Ox3, Major CN grossly intact.  Speech clear. No gross focal motor or sensory deficits in extremities.; Skin: Color normal, Warm, Dry.   ED Course   Procedures     EKG Interpretation None      MDM  MDM Reviewed: previous chart, nursing note and vitals Interpretation: x-ray    Dg Chest 2 View 05/17/2014   CLINICAL DATA:  28 year old female with cough and shortness breath. History of asthma. Subsequent encounter.  EXAM: CHEST  2 VIEW  COMPARISON:  Several prior exams most recent 04/26/2014.  FINDINGS: Minimal peribronchial thickening may be normal versus reactive changes. No segmental consolidation.  No pneumothorax  No plain film evidence of adenopathy.  Heart size within normal limits.  Mild kyphosis lower thoracic spine.  IMPRESSION: Minimal peribronchial thickening may be normal versus reactive changes. No segmental consolidation.   Electronically Signed   By: Bridgett LarssonSteve  Olson M.D.   On: 05/17/2014 08:16    0900:  Pt states she "feels better" after neb and steroid.  NAD, lungs CTA bilat, no wheezing, resps easy, speaking full sentences, Sats 95% R/A.  Pt states she wants to go home now. Tx symptomatically at this time. Dx and testing d/w pt.  Questions answered.  Verb understanding, agreeable to d/c home with outpt f/u.    Samuel JesterKathleen Avina Eberle, DO 05/19/14 [phone]

## 2014-05-17 NOTE — ED Notes (Signed)
Pt states she has had cold sxs for about a week and for the past couple of days she has been having a cough and wheezing  Pt states she has run out of her albuterol medication

## 2014-07-05 ENCOUNTER — Emergency Department (HOSPITAL_COMMUNITY)
Admission: EM | Admit: 2014-07-05 | Discharge: 2014-07-05 | Disposition: A | Discharge status: Home / Self Care | Payer: PRIVATE HEALTH INSURANCE | Attending: Emergency Medicine | Admitting: Emergency Medicine

## 2014-07-05 ENCOUNTER — Emergency Department (HOSPITAL_COMMUNITY): Payer: PRIVATE HEALTH INSURANCE

## 2014-07-05 ENCOUNTER — Encounter (HOSPITAL_COMMUNITY): Payer: Self-pay | Admitting: Emergency Medicine

## 2014-07-05 DIAGNOSIS — J45901 Unspecified asthma with (acute) exacerbation: Secondary | ICD-10-CM | POA: Diagnosis not present

## 2014-07-05 DIAGNOSIS — J45909 Unspecified asthma, uncomplicated: Secondary | ICD-10-CM | POA: Diagnosis present

## 2014-07-05 DIAGNOSIS — Z79899 Other long term (current) drug therapy: Secondary | ICD-10-CM | POA: Diagnosis not present

## 2014-07-05 DIAGNOSIS — J069 Acute upper respiratory infection, unspecified: Secondary | ICD-10-CM | POA: Diagnosis not present

## 2014-07-05 DIAGNOSIS — Z720 Tobacco use: Secondary | ICD-10-CM | POA: Diagnosis not present

## 2014-07-05 DIAGNOSIS — R000 Tachycardia, unspecified: Secondary | ICD-10-CM | POA: Insufficient documentation

## 2014-07-05 DIAGNOSIS — B9789 Other viral agents as the cause of diseases classified elsewhere: Secondary | ICD-10-CM

## 2014-07-05 LAB — BASIC METABOLIC PANEL
ANION GAP: 13 (ref 5–15)
BUN: 9 mg/dL (ref 6–23)
CHLORIDE: 100 meq/L (ref 96–112)
CO2: 24 meq/L (ref 19–32)
CREATININE: 0.58 mg/dL (ref 0.50–1.10)
Calcium: 9.4 mg/dL (ref 8.4–10.5)
GFR calc Af Amer: >90 mL/min (ref >90)
GFR calc non Af Amer: >90 mL/min (ref >90)
Glucose, Bld: 113 mg/dL — ABNORMAL HIGH (ref 70–99)
POTASSIUM: 4.1 meq/L (ref 3.7–5.3)
SODIUM: 137 meq/L (ref 137–147)

## 2014-07-05 LAB — CBC
HCT: 43.6 % (ref 36.0–46.0)
Hemoglobin: 14.5 g/dL (ref 12.0–15.0)
MCH: 31.9 pg (ref 26.0–34.0)
MCHC: 33.3 g/dL (ref 30.0–36.0)
MCV: 95.8 fL (ref 78.0–100.0)
PLATELETS: 219 10*3/uL (ref 150–400)
RBC: 4.55 MIL/uL (ref 3.87–5.11)
RDW: 12.6 % (ref 11.5–15.5)
WBC: 19.6 10*3/uL — AB (ref 4.0–10.5)

## 2014-07-05 MED ORDER — ALBUTEROL (5 MG/ML) CONTINUOUS INHALATION SOLN
10.0000 mg/h | INHALATION_SOLUTION | RESPIRATORY_TRACT | Status: AC
  Administered 2014-07-05: 10 mg/h via RESPIRATORY_TRACT
  Filled 2014-07-05 (×2): qty 20

## 2014-07-05 MED ORDER — SODIUM CHLORIDE 0.9 % IV BOLUS (SEPSIS)
500.0000 mL | Freq: Once | INTRAVENOUS | Status: AC
  Administered 2014-07-05: 500 mL via INTRAVENOUS

## 2014-07-05 MED ORDER — IPRATROPIUM BROMIDE 0.02 % IN SOLN
1.0000 mg | Freq: Once | RESPIRATORY_TRACT | Status: AC
  Administered 2014-07-05: 1 mg via RESPIRATORY_TRACT
  Filled 2014-07-05: qty 5

## 2014-07-05 MED ORDER — ALBUTEROL SULFATE HFA 108 (90 BASE) MCG/ACT IN AERS
2.0000 | INHALATION_SPRAY | RESPIRATORY_TRACT | Status: DC | PRN
  Administered 2014-07-05: 2 via RESPIRATORY_TRACT
  Filled 2014-07-05: qty 6.7

## 2014-07-05 MED ORDER — PREDNISONE 20 MG PO TABS: 40.0000 mg | ORAL_TABLET | Freq: Every day | ORAL | Status: DC

## 2014-07-05 MED ORDER — ALBUTEROL SULFATE (2.5 MG/3ML) 0.083% IN NEBU: 2.5000 mg | INHALATION_SOLUTION | RESPIRATORY_TRACT | Status: DC | PRN

## 2014-07-05 MED ORDER — IPRATROPIUM-ALBUTEROL 0.5-2.5 (3) MG/3ML IN SOLN
3.0000 mL | Freq: Once | RESPIRATORY_TRACT | Status: DC
  Filled 2014-07-05: qty 3

## 2014-07-05 MED ORDER — METHYLPREDNISOLONE SODIUM SUCC 125 MG IJ SOLR
125.0000 mg | Freq: Once | INTRAMUSCULAR | Status: AC
  Administered 2014-07-05: 125 mg via INTRAVENOUS
  Filled 2014-07-05: qty 2

## 2014-07-05 MED ORDER — ALBUTEROL SULFATE HFA 108 (90 BASE) MCG/ACT IN AERS: 2.0000 | INHALATION_SPRAY | RESPIRATORY_TRACT | Status: AC | PRN

## 2014-07-05 MED ORDER — IPRATROPIUM BROMIDE 0.02 % IN SOLN
RESPIRATORY_TRACT | Status: AC
  Filled 2014-07-05: qty 2.5

## 2014-07-05 NOTE — ED Notes (Signed)
RRT PRESENT 

## 2014-07-05 NOTE — Discharge Instructions (Signed)
1. Medications: Albuterol MDI and solution, prednisone, usual home medications 2. Treatment: rest, drink plenty of fluids, Tylenol or ibuprofen for fever control 3. Follow Up: Please followup with your primary doctor in 3 days for discussion of your diagnoses and further evaluation after today's visit; if you do not have a primary care doctor use the resource guide provided to find one; Please return to the ER for worsening wheezing, shortness of breath or other concerns    Asthma, Acute Bronchospasm Acute bronchospasm caused by asthma is also referred to as an asthma attack. Bronchospasm means your air passages become narrowed. The narrowing is caused by inflammation and tightening of the muscles in the air tubes (bronchi) in your lungs. This can make it hard to breathe or cause you to wheeze and cough. CAUSES Possible triggers are:  Animal dander from the skin, hair, or feathers of animals.  Dust mites contained in house dust.  Cockroaches.  Pollen from trees or grass.  Mold.  Cigarette or tobacco smoke.  Air pollutants such as dust, household cleaners, hair sprays, aerosol sprays, paint fumes, strong chemicals, or strong odors.  Cold air or weather changes. Cold air may trigger inflammation. Winds increase molds and pollens in the air.  Strong emotions such as crying or laughing hard.  Stress.  Certain medicines such as aspirin or beta-blockers.  Sulfites in foods and drinks, such as dried fruits and wine.  Infections or inflammatory conditions, such as a flu, cold, or inflammation of the nasal membranes (rhinitis).  Gastroesophageal reflux disease (GERD). GERD is a condition where stomach acid backs up into your esophagus.  Exercise or strenuous activity. SIGNS AND SYMPTOMS   Wheezing.  Excessive coughing, particularly at night.  Chest tightness.  Shortness of breath. DIAGNOSIS  Your health care provider will ask you about your medical history and perform a  physical exam. A chest X-ray or blood testing may be performed to look for other causes of your symptoms or other conditions that may have triggered your asthma attack. TREATMENT  Treatment is aimed at reducing inflammation and opening up the airways in your lungs. Most asthma attacks are treated with inhaled medicines. These include quick relief or rescue medicines (such as bronchodilators) and controller medicines (such as inhaled corticosteroids). These medicines are sometimes given through an inhaler or a nebulizer. Systemic steroid medicine taken by mouth or given through an IV tube also can be used to reduce the inflammation when an attack is moderate or severe. Antibiotic medicines are only used if a bacterial infection is present.  HOME CARE INSTRUCTIONS   Rest.  Drink plenty of liquids. This helps the mucus to remain thin and be easily coughed up. Only use caffeine in moderation and do not use alcohol until you have recovered from your illness.  Do not smoke. Avoid being exposed to secondhand smoke.  You play a critical role in keeping yourself in good health. Avoid exposure to things that cause you to wheeze or to have breathing problems.  Keep your medicines up-to-date and available. Carefully follow your health care provider's treatment plan.  Take your medicine exactly as prescribed.  When pollen or pollution is bad, keep windows closed and use an air conditioner or go to places with air conditioning.  Asthma requires careful medical care. See your health care provider for a follow-up as advised. If you are more than [redacted] weeks pregnant and you were prescribed any new medicines, let your obstetrician know about the visit and how you are doing.  Follow up with your health care provider as directed.  After you have recovered from your asthma attack, make an appointment with your outpatient doctor to talk about ways to reduce the likelihood of future attacks. If you do not have a doctor  who manages your asthma, make an appointment with a primary care doctor to discuss your asthma. SEEK IMMEDIATE MEDICAL CARE IF:   You are getting worse.  You have trouble breathing. If severe, call your local emergency services (911 in the [place]  You develop chest pain or discomfort.  You are vomiting.  You are not able to keep fluids down.  You are coughing up yellow, green, brown, or bloody sputum.  You have a fever and your symptoms suddenly get worse.  You have trouble swallowing. MAKE SURE YOU:   Understand these instructions.  Will watch your condition.  Will get help right away if you are not doing well or get worse. Document Released: 10/21/2006 Document Revised: 07/11/2013 Document Reviewed: 01/11/2013 [facility] Patient Information 2015 [place], [place]. This information is not intended to replace advice given to you by your health care provider. Make sure you discuss any questions you have with your health care provider.

## 2014-07-05 NOTE — ED Notes (Signed)
Pt reports having URI symptoms of sore throat, nasal congestion and HA for past few days which makes asthma flare-up. Used inhaler several times today, ran out albuterol inhaler.

## 2014-07-05 NOTE — ED Provider Notes (Signed)
CSN: [id number]     Arrival date & time 07/05/14  1815 History   First MD Initiated Contact with Patient 07/05/14 1828     Chief Complaint  Patient presents with  . Asthma     (Consider location/radiation/quality/duration/timing/severity/associated sxs/prior Treatment) Patient is a 28 y.o. female presenting with asthma. The history is provided by the patient, a friend and medical records. No language interpreter was used.  Asthma Associated symptoms include chills, congestion, coughing, fatigue, headaches and a sore throat. Pertinent negatives include no abdominal pain, arthralgias, chest pain, fever, myalgias, nausea, numbness, rash or vomiting.   Grace Parker is a 28 y.o. female  with a hx of asthma presents to the Emergency Department complaining of gradual, persistent, progressively worsening shortness of breath associated wheezing onset 3 days ago. Patient reports that she began with URI symptoms approximately 1 week ago but ran out of her albuterol nebulizers 3 days ago and out of her albuterol rescue inhaler today. Patient reports that she's previously been hospitalized for her asthma but has never required intubation. Patient reports she has a nonproductive cough, rhinorrhea, nasal congestion, sore throat and chest tenderness due to persistent coughing. Patient reports taking ibuprofen which has helped with her generalized headache but has not attempted any cold medication. She denies any history of diabetes.  She denies fever, neck pain, neck stiffness, abdominal pain, nausea, vomiting, diarrhea, weakness, dizziness, syncope, dysuria.  Past Medical History  Diagnosis Date  . Asthma    Past Surgical History  Procedure Laterality Date  . Cholecystectomy    . Hernia repair     Family History  Problem Relation Age of Onset  . Diabetes Father   . Hypertension Father    History  Substance Use Topics  . Smoking status: Current Every Day Smoker -- 0.15 packs/day    Types:  Cigarettes  . Smokeless tobacco: Not on file  . Alcohol Use: No   OB History    No data available     Review of Systems  Constitutional: Positive for chills and fatigue. Negative for fever and appetite change.  HENT: Positive for congestion, postnasal drip, rhinorrhea, sinus pressure and sore throat. Negative for ear discharge, ear pain and mouth sores.   Eyes: Negative for visual disturbance.  Respiratory: Positive for cough, chest tightness, shortness of breath and wheezing. Negative for stridor.   Cardiovascular: Negative for chest pain, palpitations and leg swelling.  Gastrointestinal: Negative for nausea, vomiting, abdominal pain and diarrhea.  Genitourinary: Negative for dysuria, urgency, frequency and hematuria.  Musculoskeletal: Negative for myalgias, back pain, arthralgias and neck stiffness.  Skin: Negative for rash.  Neurological: Positive for headaches. Negative for syncope, light-headedness and numbness.  Hematological: Negative for adenopathy.  Psychiatric/Behavioral: The patient is not nervous/anxious.   All other systems reviewed and are negative.     Allergies  Review of patient's allergies indicates no known allergies.  Home Medications   Prior to Admission medications   Medication Sig Start Date End Date Taking? Authorizing Provider  ibuprofen (ADVIL,MOTRIN) 200 MG tablet Take 400 mg by mouth every 6 (six) hours as needed for moderate pain (pain).    Yes Historical Provider, MD  albuterol (PROVENTIL HFA;VENTOLIN HFA) 108 (90 BASE) MCG/ACT inhaler Inhale 2 puffs into the lungs every 4 (four) hours as needed for wheezing or shortness of breath. 07/05/14   Aeris Hersman, PA-C  albuterol (PROVENTIL) (2.5 MG/3ML) 0.083% nebulizer solution Take 3 mLs (2.5 mg total) by nebulization every 4 (four) hours as needed for  wheezing or shortness of breath. 07/05/14   Myley Bahner, PA-C  predniSONE (DELTASONE) 20 MG tablet Take 2 tablets (40 mg total) by mouth  daily. Start 05/18/2014 07/05/14   Damira Kem, PA-C   BP 118/66 mmHg  Pulse 127  Temp(Src) 98.3 F (36.8 C) (Oral)  Resp 22  SpO2 98%  LMP 06/17/2014 Physical Exam  Constitutional: She is oriented to person, place, and time. She appears well-developed and well-nourished. No distress.  Awake, alert, nontoxic appearance  HENT:  Head: Normocephalic and atraumatic.  Right Ear: Tympanic membrane, external ear and ear canal normal.  Left Ear: Tympanic membrane, external ear and ear canal normal.  Nose: Mucosal edema and rhinorrhea present. No epistaxis. Right sinus exhibits no maxillary sinus tenderness and no frontal sinus tenderness. Left sinus exhibits no maxillary sinus tenderness and no frontal sinus tenderness.  Mouth/Throat: Uvula is midline, oropharynx is clear and moist and mucous membranes are normal. Mucous membranes are not pale and not cyanotic. No oropharyngeal exudate, posterior oropharyngeal edema, posterior oropharyngeal erythema or tonsillar abscesses.  Eyes: Conjunctivae are normal. Pupils are equal, round, and reactive to light. No scleral icterus.  Neck: Normal range of motion and full passive range of motion without pain. Neck supple.  Cardiovascular: Regular rhythm, normal heart sounds and intact distal pulses.   No murmur heard. Tachycardia  Pulmonary/Chest: Effort normal. No accessory muscle usage or stridor. No respiratory distress. She has decreased breath sounds. She has wheezes. She has no rhonchi. She has no rales.  Equal chest expansion Decreased breath sounds with inspiratory and expiratory wheezes throughout, no rhonchi or rales  Abdominal: Soft. Bowel sounds are normal. She exhibits no distension and no mass. There is no tenderness. There is no rebound and no guarding.  Musculoskeletal: Normal range of motion. She exhibits no edema.  Lymphadenopathy:    She has no cervical adenopathy.  Neurological: She is alert and oriented to person, place, and  time. She exhibits normal muscle tone. Coordination normal.  Speech is clear and goal oriented, follows commands Major Cranial nerves without deficit, no facial droop Normal strength in upper and lower extremities bilaterally including dorsiflexion and plantar flexion, strong and equal grip strength Sensation normal to light and sharp touch Moves extremities without ataxia, coordination intact Normal finger to nose and rapid alternating movements Neg romberg, no pronator drift Normal gait and balance  Skin: Skin is warm and dry. No rash noted. She is not diaphoretic. No erythema.  Psychiatric: She has a normal mood and affect.  Nursing note and vitals reviewed.   ED Course  Procedures (including critical care time) Labs Review Labs Reviewed  CBC - Abnormal; Notable for the following:    WBC 19.6 (*)    All other components within normal limits  BASIC METABOLIC PANEL - Abnormal; Notable for the following:    Glucose, Bld 113 (*)    All other components within normal limits    Imaging Review Dg Chest 2 View (if Patient Has Fever And/or Copd)  07/05/2014   CLINICAL DATA:  Short of breath and cough.  EXAM: CHEST  2 VIEW  COMPARISON:  None.  FINDINGS: The heart size and mediastinal contours are within normal limits. Both lungs are clear. The visualized skeletal structures are unremarkable.  IMPRESSION: No active cardiopulmonary disease.   Electronically Signed   By: Signa Kellaylor  Stroud M.D.   On: 07/05/2014 20:54     EKG Interpretation None      MDM   Final diagnoses:  Asthma exacerbation  Viral URI with cough   Grace Sheffieldana G Parker presents with asthma exacerbation in the setting of viral URI. Patient with tachycardia, inspiratory and expiratory wheezes throughout. Will give one hour nebulizer, steroids, chest x-ray and reassess.    8:40PM Patient ambulated in ED with O2 saturations maintained >90, no current signs of respiratory distress. Lung exam improved after nebulizer treatment  with resolution of wheezing. Prednisone given in the ED and pt will be dc with 5 day burst. Pt states they are breathing at baseline. Pt has been instructed to continue using prescribed medications and to speak with PCP about today's exacerbation. Patient remains tachycardic to approximately 150; likely secondary to albuterol usage.  Will give small fluid bolus, reassess.  Pt without risk factors for PE and denies recent travel, periods of immobilization, recent surgery or fractures; doubt PE.  10:12PM Patient remains with mild tachycardia around 115. She reports that she feels completely normal and wishes to be discharged.  I expressed concern about her persistent tachycardia, but she wishes for no further workup.  She reports she will return if symptoms return, she expresses palpitations, near syncope or other concerning symptoms.  Patient continues to breathe at baseline without wheezes or rhonchi. She denies continuing to feel short of breath and denies chest pain.  I have personally reviewed patient's vitals, nursing note and any pertinent labs or imaging.  I performed an undressed physical exam.    It has been determined that no acute conditions requiring further emergency intervention are present at this time. The patient/guardian have been advised of the diagnosis and plan. I reviewed all labs and imaging including any potential incidental findings. We have discussed signs and symptoms that warrant return to the ED and they are listed in the discharge instructions.    Vital signs are stable at discharge. HR 115 at last physical evaluation by myself at 10:12PM  BP 118/66 mmHg  Pulse 127  Temp(Src) 98.3 F (36.8 C) (Oral)  Resp 22  SpO2 98%  LMP 06/17/2014        Dahlia ClientHannah Meggin Ola, PA-C 07/06/14 0000  Ethelda ChickMartha K Linker, MD 07/06/14 0002

## 2014-08-21 IMAGING — CR DG CHEST 2V
2 series · 2 of 2 positions shown · non-contrast
Comparison: Chest radiograph performed 01/17/2014

CLINICAL DATA: Cough and wheezing for 1 week.  History of asthma.

EXAM:
CHEST  2 VIEW

[w chest pa]
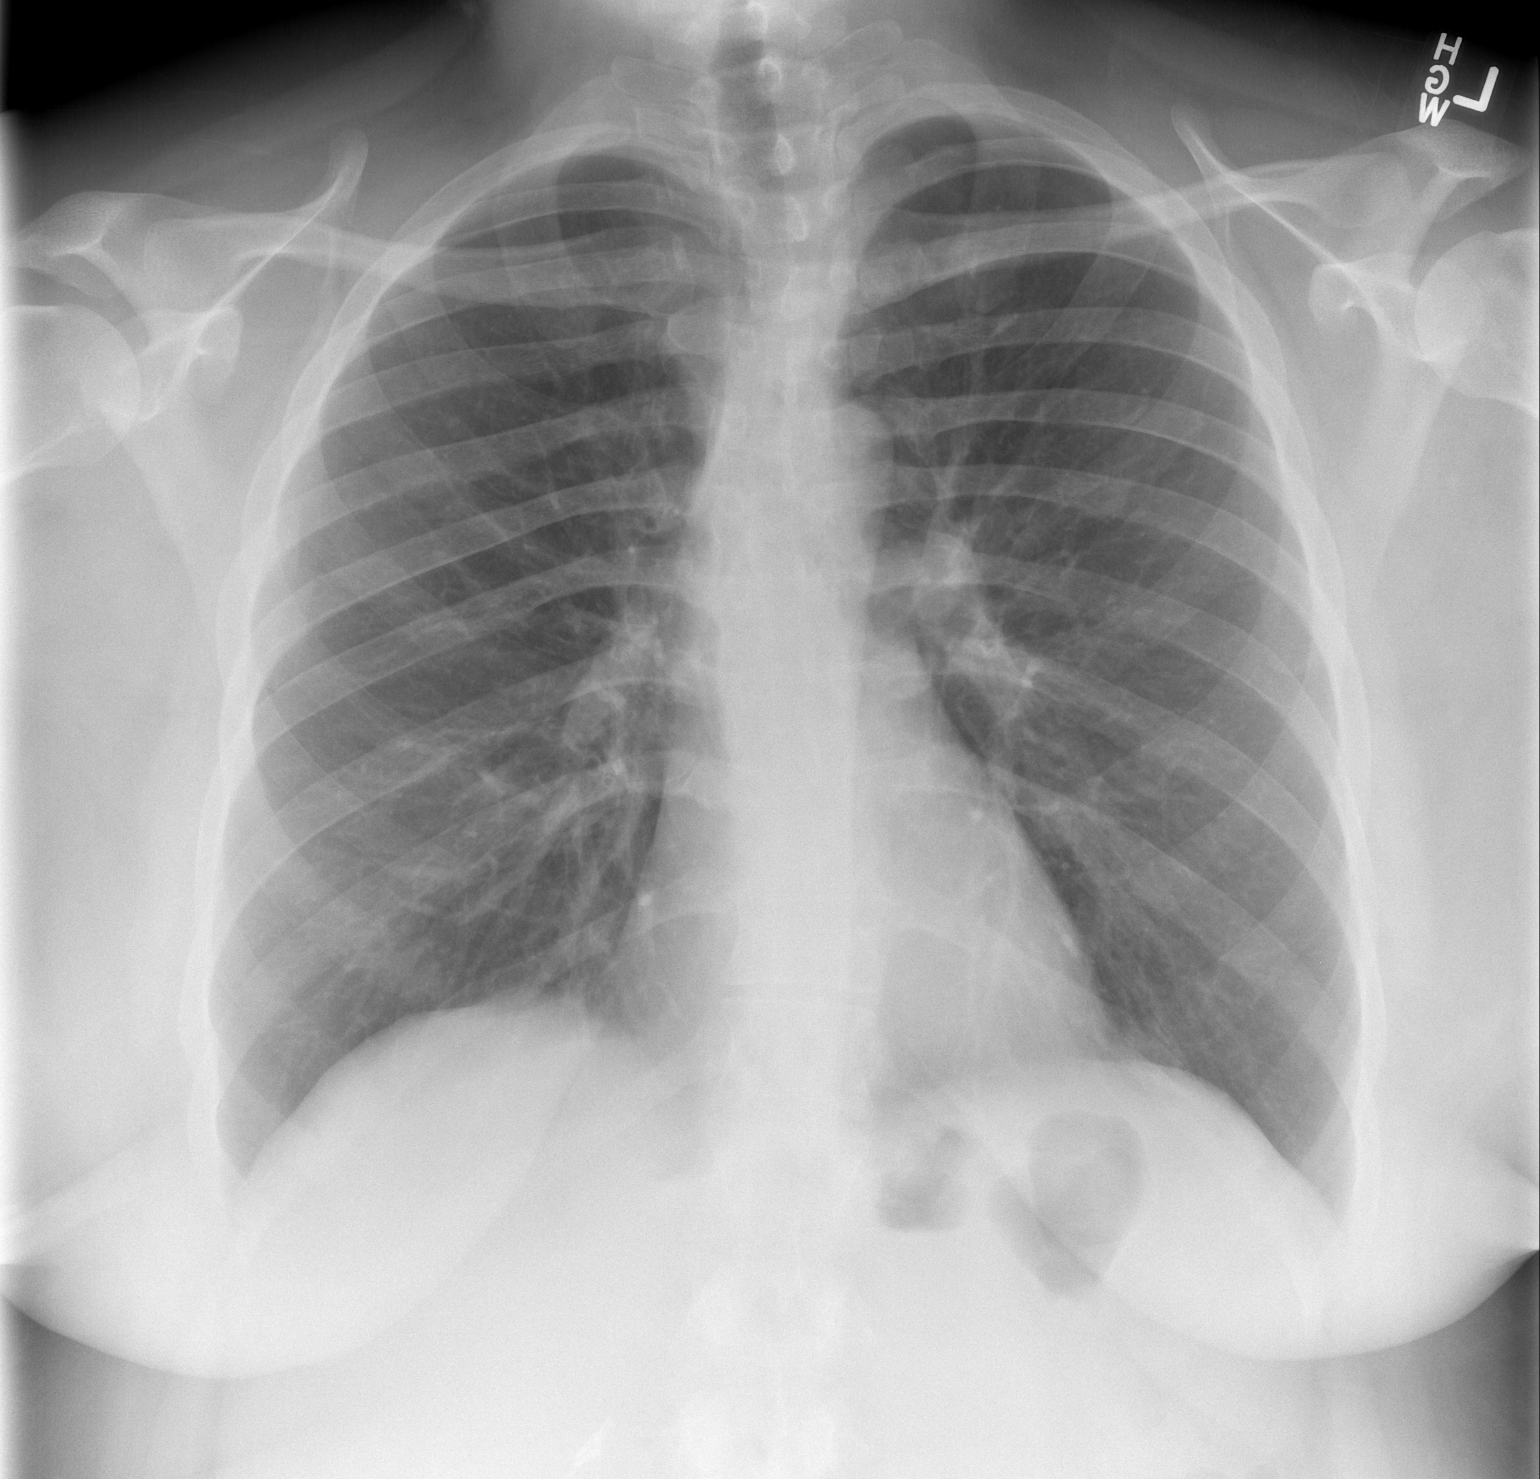

[w chest lat]
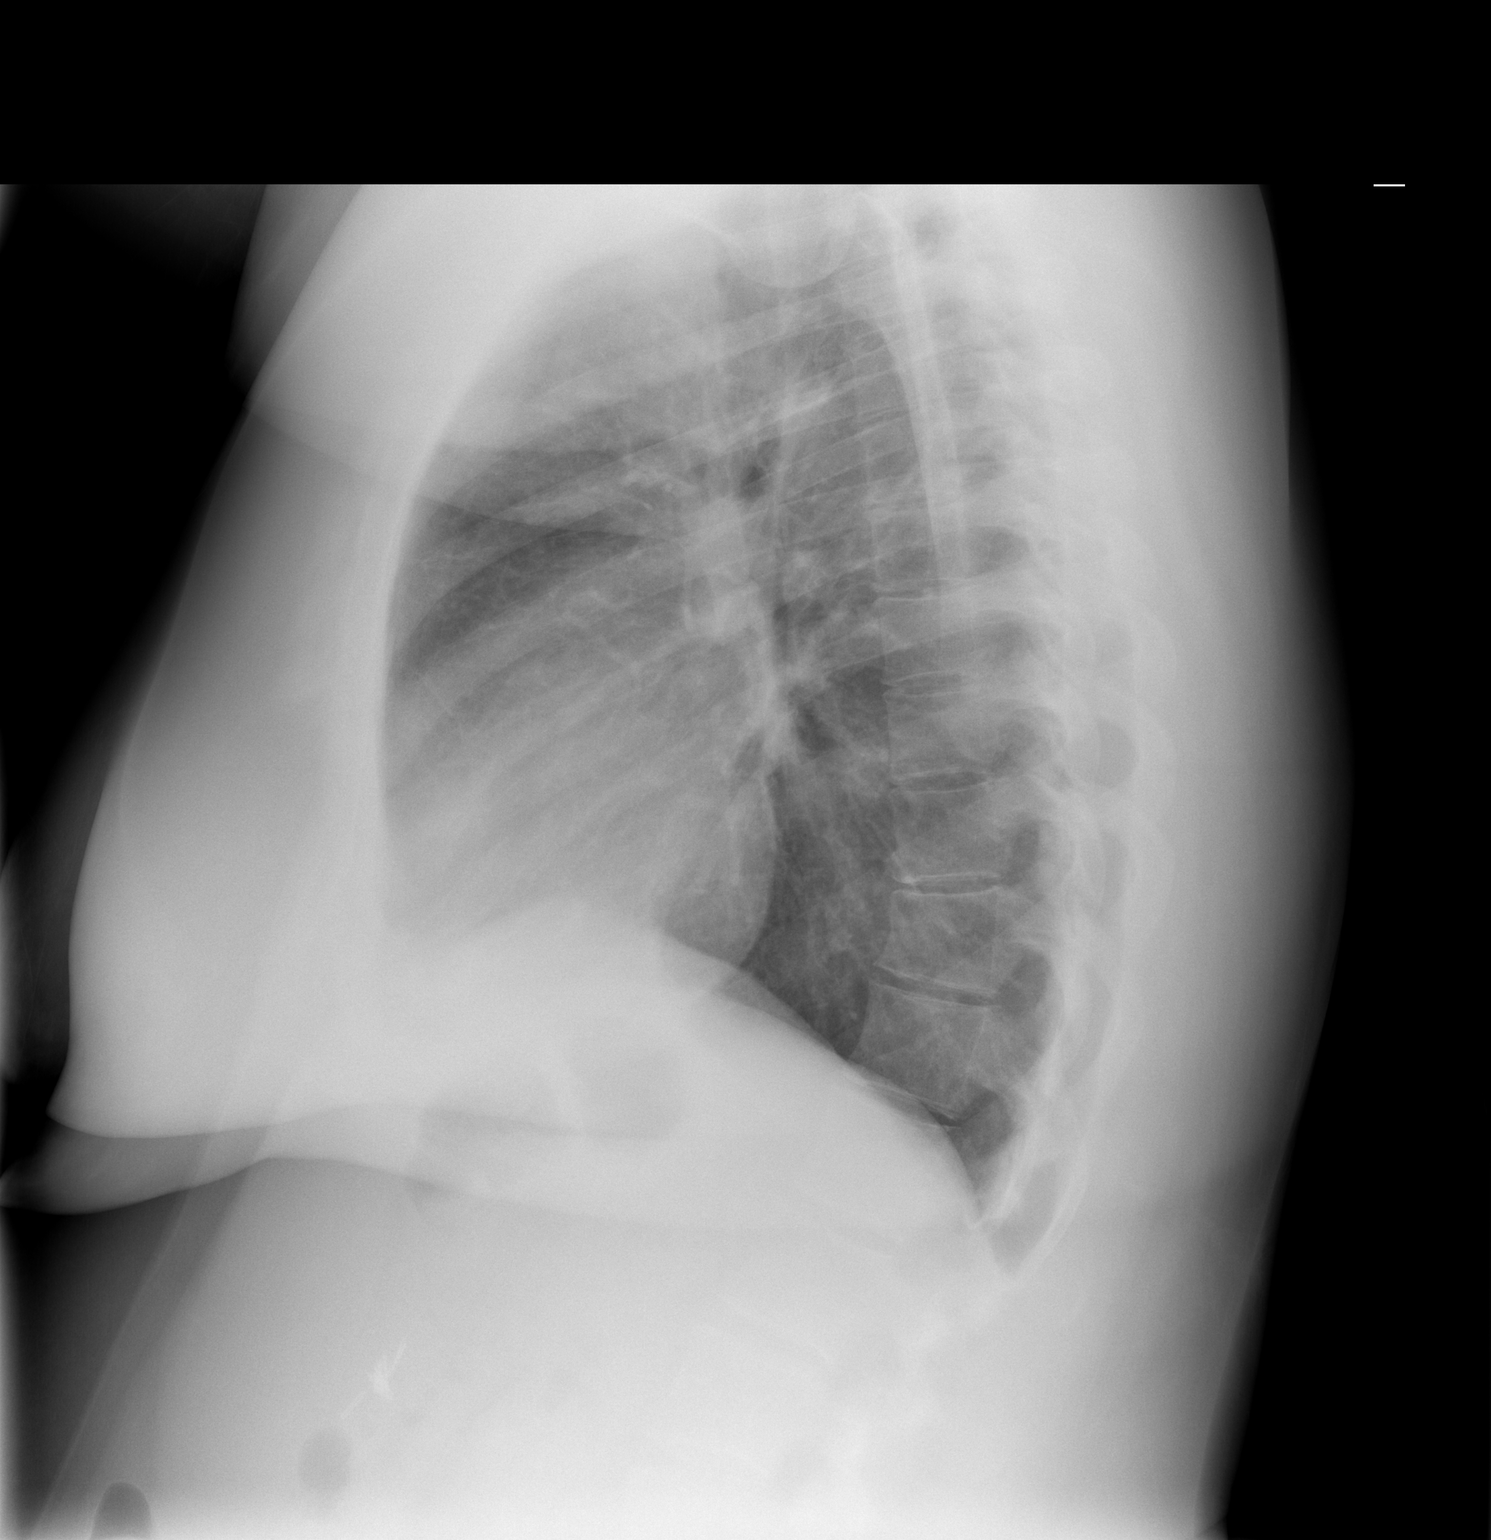

[2 of 2 positions shown; findings below may reference images not displayed]

FINDINGS: The lungs are well-aerated and clear. There is no evidence of focal
opacification, pleural effusion or pneumothorax.

The heart is normal in size; the mediastinal contour is within
normal limits. No acute osseous abnormalities are seen. Clips are
noted within the right upper quadrant, reflecting prior
cholecystectomy.
IMPRESSION: No acute cardiopulmonary process seen.

## 2014-10-14 ENCOUNTER — Emergency Department (HOSPITAL_COMMUNITY)
Admission: EM | Admit: 2014-10-14 | Discharge: 2014-10-14 | Disposition: A | Discharge status: Home / Self Care | Payer: PRIVATE HEALTH INSURANCE | Attending: Emergency Medicine | Admitting: Emergency Medicine

## 2014-10-14 ENCOUNTER — Encounter (HOSPITAL_COMMUNITY): Payer: Self-pay | Admitting: Emergency Medicine

## 2014-10-14 DIAGNOSIS — Z720 Tobacco use: Secondary | ICD-10-CM | POA: Diagnosis not present

## 2014-10-14 DIAGNOSIS — Z79899 Other long term (current) drug therapy: Secondary | ICD-10-CM | POA: Diagnosis not present

## 2014-10-14 DIAGNOSIS — R05 Cough: Secondary | ICD-10-CM | POA: Diagnosis present

## 2014-10-14 DIAGNOSIS — J45901 Unspecified asthma with (acute) exacerbation: Secondary | ICD-10-CM | POA: Diagnosis not present

## 2014-10-14 MED ORDER — PREDNISONE 20 MG PO TABS
60.0000 mg | ORAL_TABLET | Freq: Once | ORAL | Status: AC
  Administered 2014-10-14: 60 mg via ORAL
  Filled 2014-10-14: qty 3

## 2014-10-14 MED ORDER — IPRATROPIUM-ALBUTEROL 0.5-2.5 (3) MG/3ML IN SOLN
3.0000 mL | Freq: Once | RESPIRATORY_TRACT | Status: AC
  Administered 2014-10-14: 3 mL via RESPIRATORY_TRACT
  Filled 2014-10-14: qty 3

## 2014-10-14 MED ORDER — ALBUTEROL SULFATE (2.5 MG/3ML) 0.083% IN NEBU: 2.5000 mg | INHALATION_SOLUTION | RESPIRATORY_TRACT | Status: AC | PRN

## 2014-10-14 MED ORDER — ALBUTEROL SULFATE HFA 108 (90 BASE) MCG/ACT IN AERS
2.0000 | INHALATION_SPRAY | Freq: Once | RESPIRATORY_TRACT | Status: DC
  Filled 2014-10-14: qty 6.7

## 2014-10-14 MED ORDER — PREDNISONE 20 MG PO TABS: ORAL_TABLET | ORAL | Status: DC

## 2014-10-14 NOTE — ED Provider Notes (Signed)
CSN: [id number]     Arrival date & time 10/14/14  0940 History   First MD Initiated Contact with Patient 10/14/14 [phone]     Chief Complaint  Patient presents with  . Cough  . Asthma     (Consider location/radiation/quality/duration/timing/severity/associated sxs/prior Treatment) HPI Comments: 29 year old female past medical history of asthma presenting with an asthma exacerbation 2 days. Reports she had a cold about 2 weeks ago with a productive cough and subjective fevers, was feeling better until 2 days ago when she started wheezing. Cough is productive with clear mucus. No aggravating or alleviating factors. No further fevers. She ran out of her inhaler prior to her cold onset. States she has not had an asthma exacerbation "in a while".  Patient is a 29 y.o. female presenting with cough and asthma. The history is provided by the patient.  Cough Cough characteristics:  Productive Sputum characteristics:  Clear Severity:  Mild Onset quality:  Gradual Timing:  Intermittent Progression:  Unchanged Worsened by:  Nothing tried Associated symptoms: wheezing   Asthma Associated symptoms include coughing.    Past Medical History  Diagnosis Date  . Asthma    Past Surgical History  Procedure Laterality Date  . Cholecystectomy    . Hernia repair     Family History  Problem Relation Age of Onset  . Diabetes Father   . Hypertension Father    History  Substance Use Topics  . Smoking status: Current Every Day Smoker -- 0.15 packs/day    Types: Cigarettes  . Smokeless tobacco: Not on file  . Alcohol Use: No   OB History    No data available     Review of Systems  Respiratory: Positive for cough and wheezing.   All other systems reviewed and are negative.     Allergies  Review of patient's allergies indicates no known allergies.  Home Medications   Prior to Admission medications   Medication Sig Start Date End Date Taking? Authorizing Provider  albuterol (PROVENTIL  HFA;VENTOLIN HFA) 108 (90 BASE) MCG/ACT inhaler Inhale 2 puffs into the lungs every 4 (four) hours as needed for wheezing or shortness of breath. 07/05/14   Hannah Muthersbaugh, PA-C  albuterol (PROVENTIL) (2.5 MG/3ML) 0.083% nebulizer solution Take 3 mLs (2.5 mg total) by nebulization every 4 (four) hours as needed for wheezing or shortness of breath. 10/14/14   Kathrynn Speed, PA-C  ibuprofen (ADVIL,MOTRIN) 200 MG tablet Take 400 mg by mouth every 6 (six) hours as needed for moderate pain (pain).     Historical Provider, MD  predniSONE (DELTASONE) 20 MG tablet 2 tabs po daily x 4 days 10/14/14   Nada Boozer Dimarco Minkin, PA-C   BP 142/86 mmHg  Pulse 64  Temp(Src) 98.1 F (36.7 C) (Oral)  Resp 18  SpO2 98% Physical Exam  Constitutional: She is oriented to person, place, and time. She appears well-developed and well-nourished. No distress.  HENT:  Head: Normocephalic and atraumatic.  Mouth/Throat: Oropharynx is clear and moist.  Eyes: Conjunctivae and EOM are normal.  Neck: Normal range of motion. Neck supple.  Cardiovascular: Normal rate, regular rhythm and normal heart sounds.   Pulmonary/Chest: Effort normal. No respiratory distress.  Diffuse inspiratory/expiratory wheezes bilateral.  Musculoskeletal: Normal range of motion. She exhibits no edema.  Neurological: She is alert and oriented to person, place, and time. No sensory deficit.  Skin: Skin is warm and dry.  Psychiatric: She has a normal mood and affect. Her behavior is normal.  Nursing note and vitals reviewed.  ED Course  Procedures (including critical care time) Labs Review Labs Reviewed - No data to display  Imaging Review No results found.   EKG Interpretation None      MDM   Final diagnoses:  Asthma exacerbation   NAD. AF VSS. O2 sat 98% on room air. Received a nebulizer treatment prior to my evaluation, reports she is feeling much better. Moving air well. Still with wheezes after nebulizer treatment, however they're not  severe. Oral prednisone given. Will discharge patient home with a short burst of prednisone along with albuterol inhaler and a refill of her nebulizer solution. Doubt pneumonia, no fevers, no crackles or rhonchi. Resources given for PCP follow-up. Stable for discharge. Return precautions given. Patient states understanding of treatment care plan and is agreeable.  Kathrynn SpeedRobyn M Mandi Mattioli, PA-C 10/14/14 1022  Nelva Nayobert Beaton, MD 10/15/14 [phone]

## 2014-10-14 NOTE — ED Notes (Signed)
Bed: WTR5 Expected date:  Expected time:  Means of arrival:  Comments: 

## 2014-10-14 NOTE — ED Notes (Signed)
Pt c/o asthma flare post cold.  States that she had a cough x 2 wks ago, cough has gotten a little bit better but started wheezing x 2 days ago.  States that she does not have an inhaler at home.  Inspiratory wheezing.

## 2014-10-14 NOTE — Discharge Instructions (Signed)
Use albuterol inhaler or albuterol nebulizer every 4-6 hours as needed for cough and wheezing. Take prednisone daily as directed, begin taking this medication tomorrow as you were given the first dose in the emergency department today. Follow-up with the wellness clinic to establish care with a primary care physician. Asthma, Acute Bronchospasm Acute bronchospasm caused by asthma is also referred to as an asthma attack. Bronchospasm means your air passages become narrowed. The narrowing is caused by inflammation and tightening of the muscles in the air tubes (bronchi) in your lungs. This can make it hard to breathe or cause you to wheeze and cough. CAUSES Possible triggers are:  Animal dander from the skin, hair, or feathers of animals.  Dust mites contained in house dust.  Cockroaches.  Pollen from trees or grass.  Mold.  Cigarette or tobacco smoke.  Air pollutants such as dust, household cleaners, hair sprays, aerosol sprays, paint fumes, strong chemicals, or strong odors.  Cold air or weather changes. Cold air may trigger inflammation. Winds increase molds and pollens in the air.  Strong emotions such as crying or laughing hard.  Stress.  Certain medicines such as aspirin or beta-blockers.  Sulfites in foods and drinks, such as dried fruits and wine.  Infections or inflammatory conditions, such as a flu, cold, or inflammation of the nasal membranes (rhinitis).  Gastroesophageal reflux disease (GERD). GERD is a condition where stomach acid backs up into your esophagus.  Exercise or strenuous activity. SIGNS AND SYMPTOMS   Wheezing.  Excessive coughing, particularly at night.  Chest tightness.  Shortness of breath. DIAGNOSIS  Your health care provider will ask you about your medical history and perform a physical exam. A chest X-ray or blood testing may be performed to look for other causes of your symptoms or other conditions that may have triggered your asthma  attack. TREATMENT  Treatment is aimed at reducing inflammation and opening up the airways in your lungs. Most asthma attacks are treated with inhaled medicines. These include quick relief or rescue medicines (such as bronchodilators) and controller medicines (such as inhaled corticosteroids). These medicines are sometimes given through an inhaler or a nebulizer. Systemic steroid medicine taken by mouth or given through an IV tube also can be used to reduce the inflammation when an attack is moderate or severe. Antibiotic medicines are only used if a bacterial infection is present.  HOME CARE INSTRUCTIONS   Rest.  Drink plenty of liquids. This helps the mucus to remain thin and be easily coughed up. Only use caffeine in moderation and do not use alcohol until you have recovered from your illness.  Do not smoke. Avoid being exposed to secondhand smoke.  You play a critical role in keeping yourself in good health. Avoid exposure to things that cause you to wheeze or to have breathing problems.  Keep your medicines up-to-date and available. Carefully follow your health care provider's treatment plan.  Take your medicine exactly as prescribed.  When pollen or pollution is bad, keep windows closed and use an air conditioner or go to places with air conditioning.  Asthma requires careful medical care. See your health care provider for a follow-up as advised. If you are more than [redacted] weeks pregnant and you were prescribed any new medicines, let your obstetrician know about the visit and how you are doing. Follow up with your health care provider as directed.  After you have recovered from your asthma attack, make an appointment with your outpatient doctor to talk about ways to  reduce the likelihood of future attacks. If you do not have a doctor who manages your asthma, make an appointment with a primary care doctor to discuss your asthma. SEEK IMMEDIATE MEDICAL CARE IF:   You are getting  worse.  You have trouble breathing. If severe, call your local emergency services (911 in the [place]  You develop chest pain or discomfort.  You are vomiting.  You are not able to keep fluids down.  You are coughing up yellow, green, brown, or bloody sputum.  You have a fever and your symptoms suddenly get worse.  You have trouble swallowing. MAKE SURE YOU:   Understand these instructions.  Will watch your condition.  Will get help right away if you are not doing well or get worse. Document Released: 10/21/2006 Document Revised: 07/11/2013 Document Reviewed: 01/11/2013 [facility] Patient Information 2015 [employer] [place]. This information is not intended to replace advice given to you by your health care provider. Make sure you discuss any questions you have with your health care provider.

## 2014-11-11 IMAGING — CR DG CHEST 2V
2 series · 2 of 2 positions shown · non-contrast
Comparison: Several prior exams most recent 04/26/2014.

CLINICAL DATA: 28-year-old female with cough and shortness breath.
History of asthma. Subsequent encounter.

EXAM:
CHEST  2 VIEW

[w chest pa]
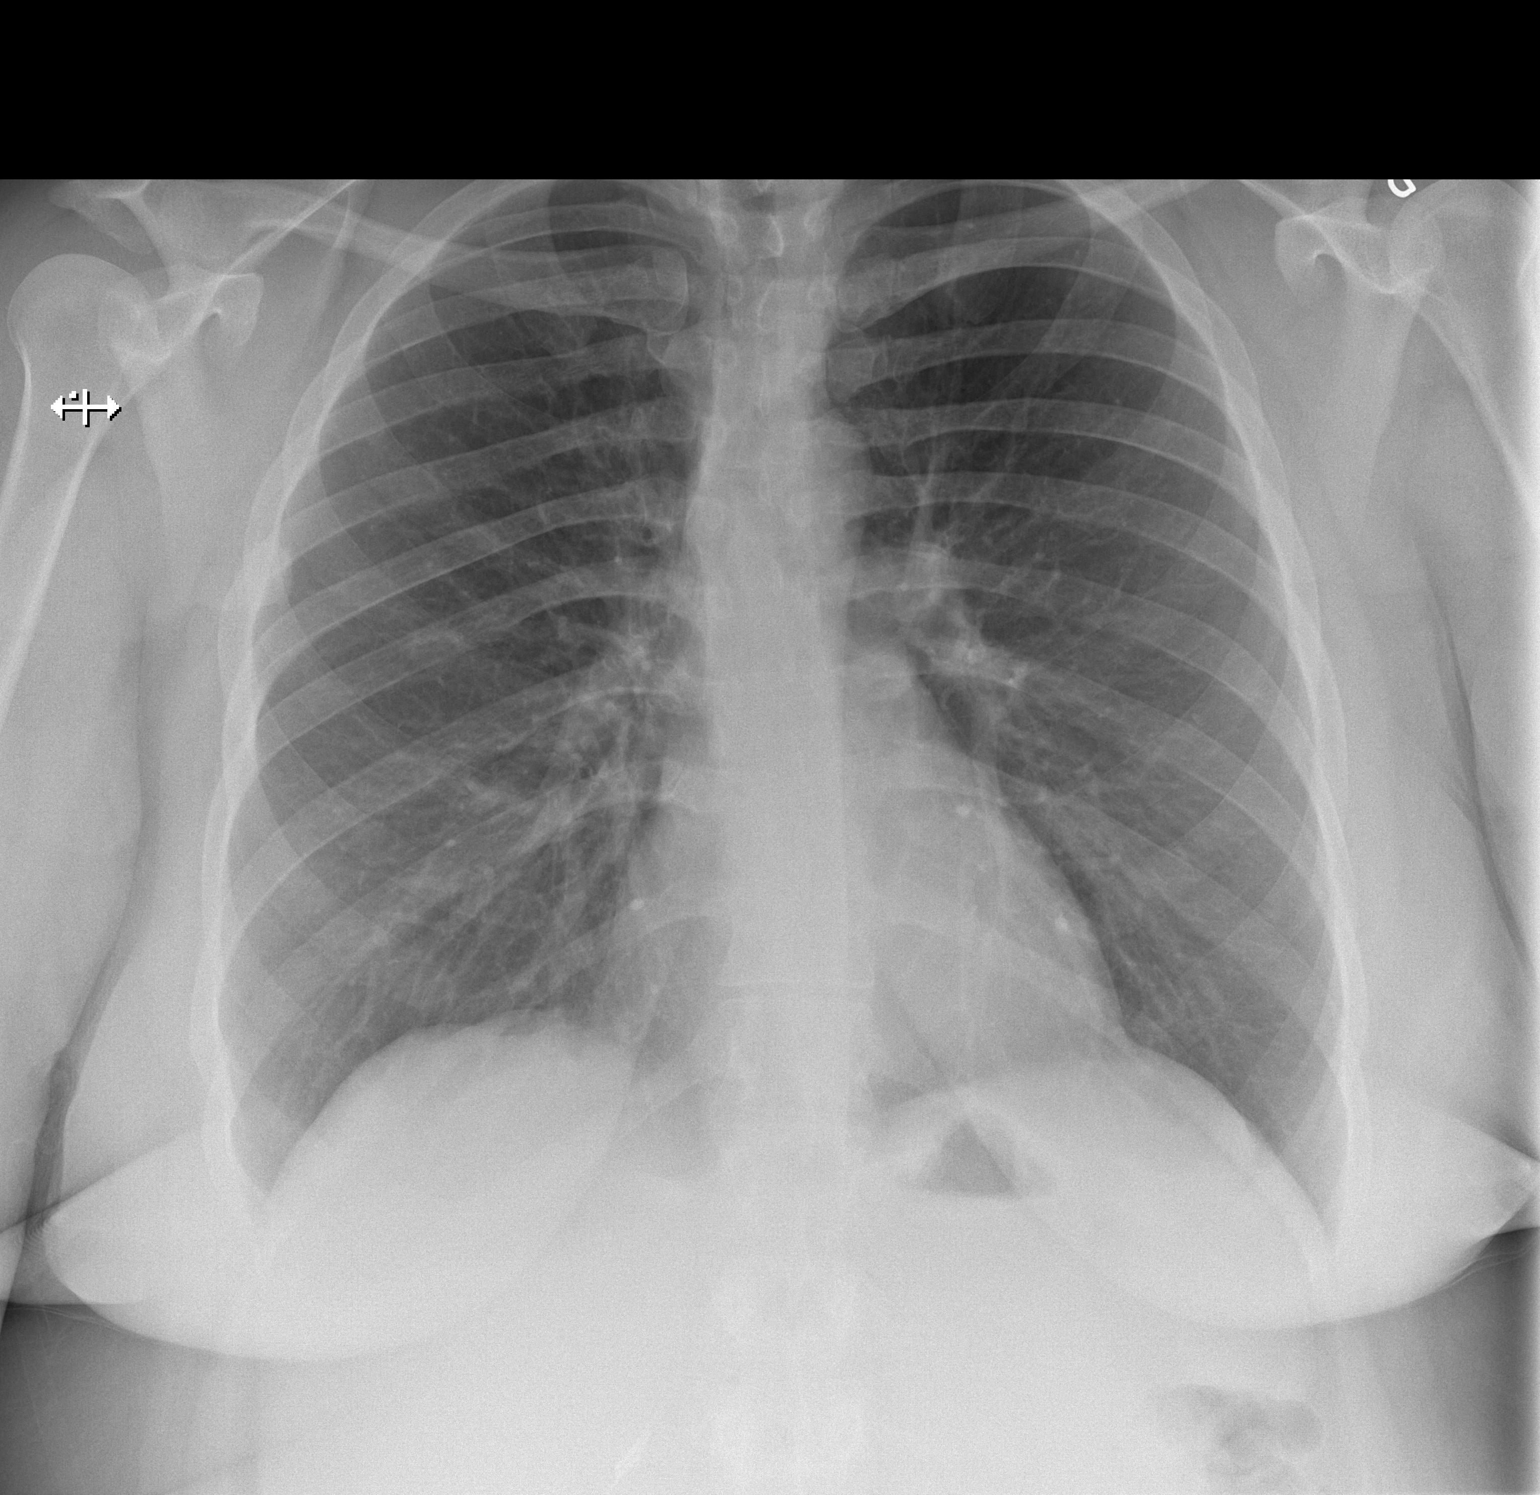

[w chest lat]
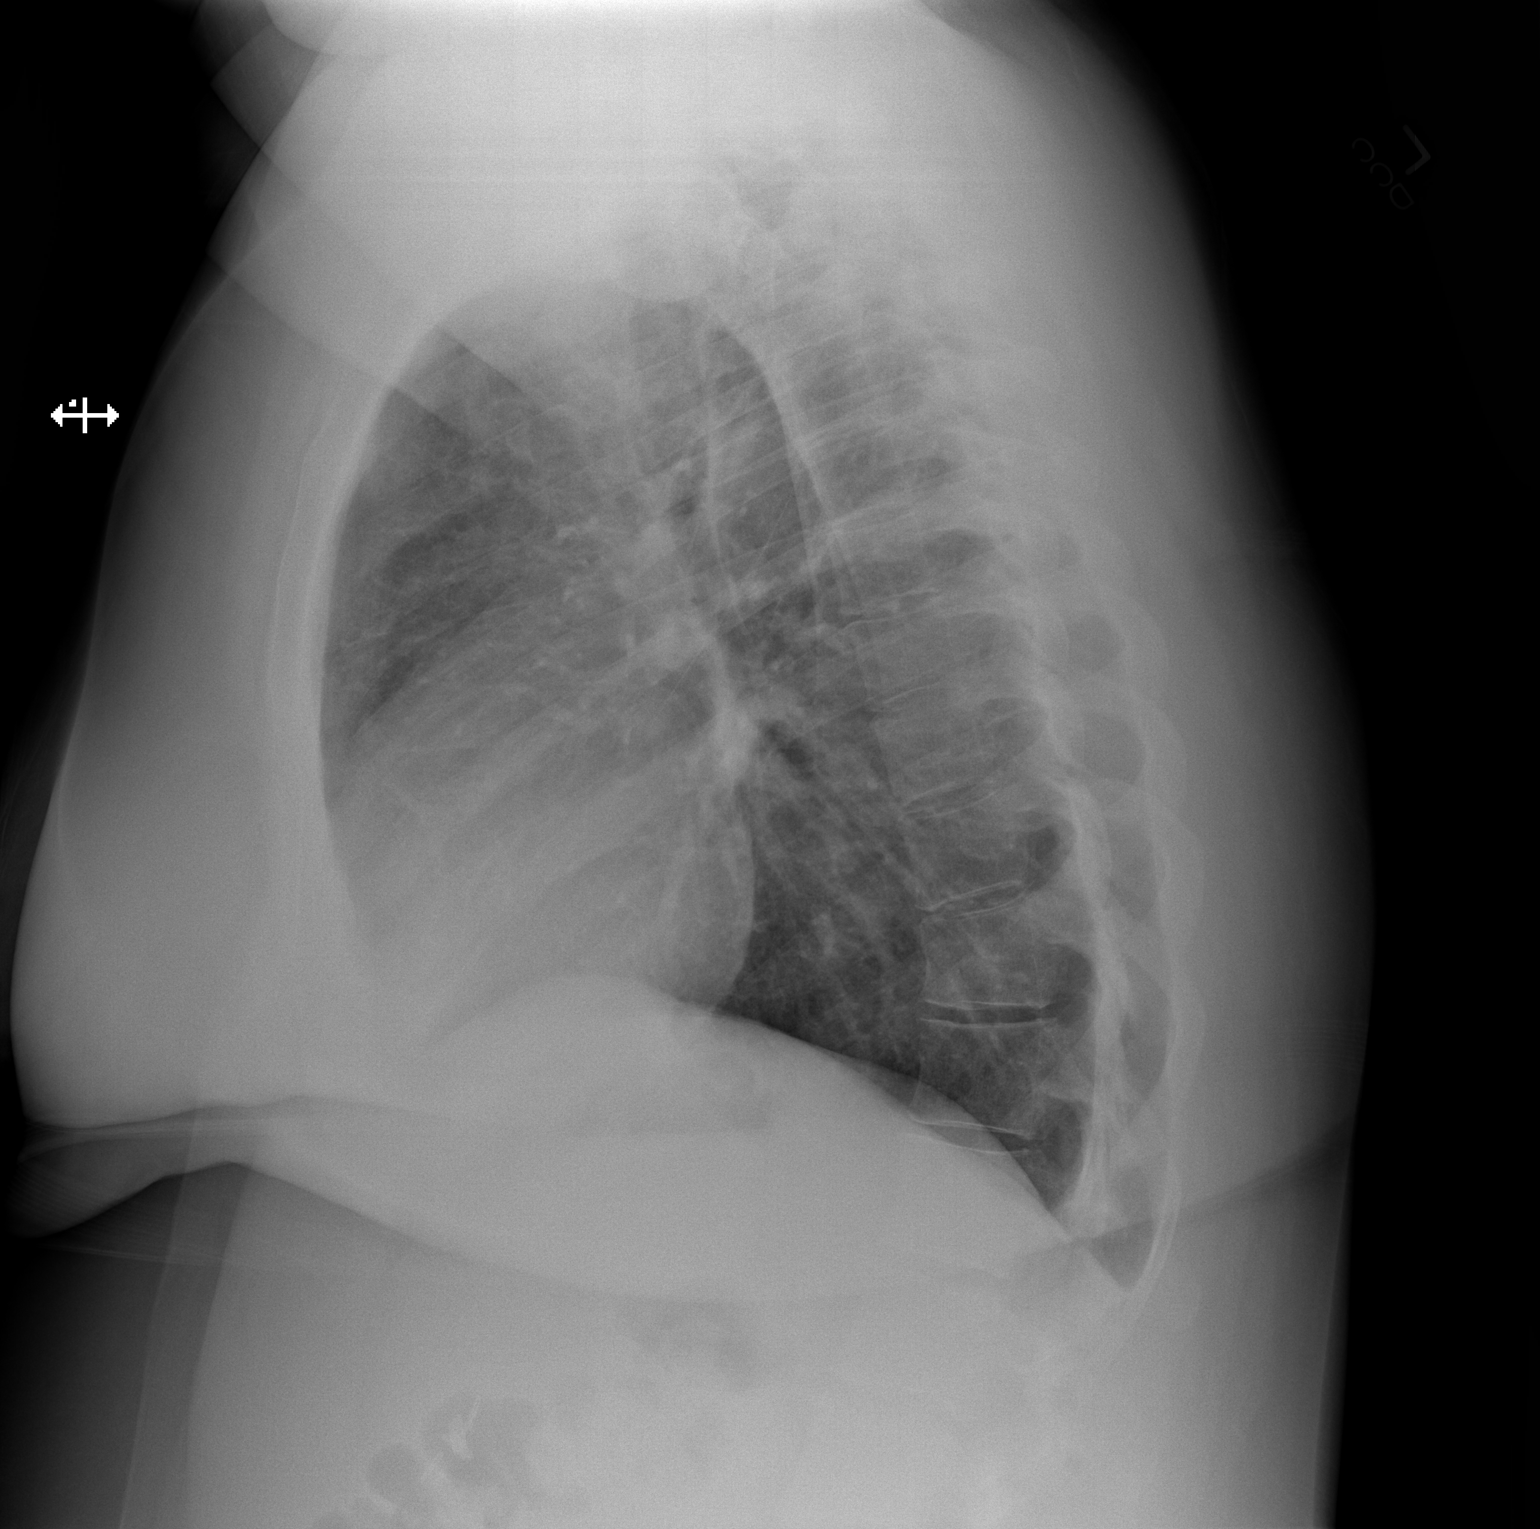

[2 of 2 positions shown; findings below may reference images not displayed]

FINDINGS: Minimal peribronchial thickening may be normal versus reactive
changes. No segmental consolidation.

No pneumothorax

No plain film evidence of adenopathy.

Heart size within normal limits.

Mild kyphosis lower thoracic spine.
IMPRESSION: Minimal peribronchial thickening may be normal versus reactive
changes. No segmental consolidation.

## 2019-02-03 ENCOUNTER — Emergency Department (HOSPITAL_COMMUNITY)
Admission: EM | Admit: 2019-02-03 | Discharge: 2019-02-03 | Disposition: A | Discharge status: Home / Self Care | Payer: PRIVATE HEALTH INSURANCE | Attending: Emergency Medicine | Admitting: Emergency Medicine

## 2019-02-03 ENCOUNTER — Encounter (HOSPITAL_COMMUNITY): Payer: Self-pay | Admitting: Emergency Medicine

## 2019-02-03 ENCOUNTER — Other Ambulatory Visit: Payer: Self-pay

## 2019-02-03 DIAGNOSIS — R51 Headache: Secondary | ICD-10-CM | POA: Insufficient documentation

## 2019-02-03 DIAGNOSIS — Z20828 Contact with and (suspected) exposure to other viral communicable diseases: Secondary | ICD-10-CM | POA: Insufficient documentation

## 2019-02-03 DIAGNOSIS — R197 Diarrhea, unspecified: Secondary | ICD-10-CM | POA: Insufficient documentation

## 2019-02-03 DIAGNOSIS — J45901 Unspecified asthma with (acute) exacerbation: Secondary | ICD-10-CM | POA: Insufficient documentation

## 2019-02-03 DIAGNOSIS — F17210 Nicotine dependence, cigarettes, uncomplicated: Secondary | ICD-10-CM | POA: Insufficient documentation

## 2019-02-03 DIAGNOSIS — R0602 Shortness of breath: Secondary | ICD-10-CM | POA: Insufficient documentation

## 2019-02-03 LAB — BASIC METABOLIC PANEL
Anion gap: 13 (ref 5–15)
BUN: 6 mg/dL (ref 6–20)
CO2: 23 mmol/L (ref 22–32)
Calcium: 9.3 mg/dL (ref 8.9–10.3)
Chloride: 100 mmol/L (ref 98–111)
Creatinine, Ser: 0.61 mg/dL (ref 0.44–1.00)
GFR calc Af Amer: >60 mL/min (ref >60)
GFR calc non Af Amer: >60 mL/min (ref >60)
Glucose, Bld: 134 mg/dL — ABNORMAL HIGH (ref 70–99)
Potassium: 3.4 mmol/L — ABNORMAL LOW (ref 3.5–5.1)
Sodium: 136 mmol/L (ref 135–145)

## 2019-02-03 LAB — CBC
HCT: 46.5 % — ABNORMAL HIGH (ref 36.0–46.0)
Hemoglobin: 15.8 g/dL — ABNORMAL HIGH (ref 12.0–15.0)
MCH: 31.8 pg (ref 26.0–34.0)
MCHC: 34 g/dL (ref 30.0–36.0)
MCV: 93.6 fL (ref 80.0–100.0)
Platelets: 287 10*3/uL (ref 150–400)
RBC: 4.97 MIL/uL (ref 3.87–5.11)
RDW: 12.1 % (ref 11.5–15.5)
WBC: 14.4 10*3/uL — ABNORMAL HIGH (ref 4.0–10.5)
nRBC: 0 % (ref 0.0–0.2)

## 2019-02-03 MED ORDER — PREDNISONE 10 MG PO TABS: 20.0000 mg | ORAL_TABLET | Freq: Two times a day (BID) | ORAL | 0 refills | Status: AC

## 2019-02-03 MED ORDER — ALBUTEROL SULFATE HFA 108 (90 BASE) MCG/ACT IN AERS
1.0000 | INHALATION_SPRAY | Freq: Once | RESPIRATORY_TRACT | Status: AC
  Administered 2019-02-03: 2 via RESPIRATORY_TRACT
  Filled 2019-02-03: qty 6.7

## 2019-02-03 NOTE — Discharge Instructions (Addendum)
Begin taking prednisone as prescribed.  Continue use of your nebulizer, 1 treatment every 4 hours as needed.  You can also use 2 puffs of your inhaler every 4-6 hours as needed.  Isolate yourself for the next several days until the results of your COVID-19 test are known.  Return to the emergency department if you develop severe chest pain, worsening breathing, or other new and concerning symptoms.      Person Under Monitoring Name: Grace Parker  Location: [id number] [place] [id number]   Infection Prevention Recommendations for Individuals Confirmed to have, or Being Evaluated for, 2019 Novel Coronavirus (COVID-19) Infection Who Receive Care at Home  Individuals who are confirmed to have, or are being evaluated for, COVID-19 should follow the prevention steps below until a healthcare provider or local or state health department says they can return to normal activities.  Stay home except to get medical care You should restrict activities outside your home, except for getting medical care. Do not go to work, school, or public areas, and do not use public transportation or taxis.  Call ahead before visiting your doctor Before your medical appointment, call the healthcare provider and tell them that you have, or are being evaluated for, COVID-19 infection. This will help the healthcare providers office take steps to keep other people from getting infected. Ask your healthcare provider to call the local or state health department.  Monitor your symptoms Seek prompt medical attention if your illness is worsening (e.g., difficulty breathing). Before going to your medical appointment, call the healthcare provider and tell them that you have, or are being evaluated for, COVID-19 infection. Ask your healthcare provider to call the local or state health department.  Wear a facemask You should wear a facemask that covers your nose and mouth when you are in the same room with other  people and when you visit a healthcare provider. People who live with or visit you should also wear a facemask while they are in the same room with you.  Separate yourself from other people in your home As much as possible, you should stay in a different room from other people in your home. Also, you should use a separate bathroom, if available.  Avoid sharing household items You should not share dishes, drinking glasses, cups, eating utensils, towels, bedding, or other items with other people in your home. After using these items, you should wash them thoroughly with soap and water.  Cover your coughs and sneezes Cover your mouth and nose with a tissue when you cough or sneeze, or you can cough or sneeze into your sleeve. Throw used tissues in a lined trash can, and immediately wash your hands with soap and water for at least 20 seconds or use an alcohol-based hand rub.  Wash your Union Pacific Corporationhands Wash your hands often and thoroughly with soap and water for at least 20 seconds. You can use an alcohol-based hand sanitizer if soap and water are not available and if your hands are not visibly dirty. Avoid touching your eyes, nose, and mouth with unwashed hands.   Prevention Steps for Caregivers and Household Members of Individuals Confirmed to have, or Being Evaluated for, COVID-19 Infection Being Cared for in the Home  If you live with, or provide care at home for, a person confirmed to have, or being evaluated for, COVID-19 infection please follow these guidelines to prevent infection:  Follow healthcare providers instructions Make sure that you understand and can help the patient  follow any healthcare provider instructions for all care.  Provide for the patients basic needs You should help the patient with basic needs in the home and provide support for getting groceries, prescriptions, and other personal needs.  Monitor the patients symptoms If they are getting sicker, call his or her  medical provider and tell them that the patient has, or is being evaluated for, COVID-19 infection. This will help the healthcare providers office take steps to keep other people from getting infected. Ask the healthcare provider to call the local or state health department.  Limit the number of people who have contact with the patient If possible, have only one caregiver for the patient. Other household members should stay in another home or place of residence. If this is not possible, they should stay in another room, or be separated from the patient as much as possible. Use a separate bathroom, if available. Restrict visitors who do not have an essential need to be in the home.  Keep older adults, very young children, and other sick people away from the patient Keep older adults, very young children, and those who have compromised immune systems or chronic health conditions away from the patient. This includes people with chronic heart, lung, or kidney conditions, diabetes, and cancer.  Ensure good ventilation Make sure that shared spaces in the home have good air flow, such as from an air conditioner or an opened window, weather permitting.  Wash your hands often Wash your hands often and thoroughly with soap and water for at least 20 seconds. You can use an alcohol based hand sanitizer if soap and water are not available and if your hands are not visibly dirty. Avoid touching your eyes, nose, and mouth with unwashed hands. Use disposable paper towels to dry your hands. If not available, use dedicated cloth towels and replace them when they become wet.  Wear a facemask and gloves Wear a disposable facemask at all times in the room and gloves when you touch or have contact with the patients blood, body fluids, and/or secretions or excretions, such as sweat, saliva, sputum, nasal mucus, vomit, urine, or feces.  Ensure the mask fits over your nose and mouth tightly, and do not touch it  during use. Throw out disposable facemasks and gloves after using them. Do not reuse. Wash your hands immediately after removing your facemask and gloves. If your personal clothing becomes contaminated, carefully remove clothing and launder. Wash your hands after handling contaminated clothing. Place all used disposable facemasks, gloves, and other waste in a lined container before disposing them with other household waste. Remove gloves and wash your hands immediately after handling these items.  Do not share dishes, glasses, or other household items with the patient Avoid sharing household items. You should not share dishes, drinking glasses, cups, eating utensils, towels, bedding, or other items with a patient who is confirmed to have, or being evaluated for, COVID-19 infection. After the person uses these items, you should wash them thoroughly with soap and water.  Wash laundry thoroughly Immediately remove and wash clothes or bedding that have blood, body fluids, and/or secretions or excretions, such as sweat, saliva, sputum, nasal mucus, vomit, urine, or feces, on them. Wear gloves when handling laundry from the patient. Read and follow directions on labels of laundry or clothing items and detergent. In general, wash and dry with the warmest temperatures recommended on the label.  Clean all areas the individual has used often Clean all touchable surfaces, such as  counters, tabletops, doorknobs, bathroom fixtures, toilets, phones, keyboards, tablets, and bedside tables, every day. Also, clean any surfaces that may have blood, body fluids, and/or secretions or excretions on them. Wear gloves when cleaning surfaces the patient has come in contact with. Use a diluted bleach solution (e.g., dilute bleach with 1 part bleach and 10 parts water) or a household disinfectant with a label that says EPA-registered for coronaviruses. To make a bleach solution at home, add 1 tablespoon of bleach to 1  quart (4 cups) of water. For a larger supply, add  cup of bleach to 1 gallon (16 cups) of water. Read labels of cleaning products and follow recommendations provided on product labels. Labels contain instructions for safe and effective use of the cleaning product including precautions you should take when applying the product, such as wearing gloves or eye protection and making sure you have good ventilation during use of the product. Remove gloves and wash hands immediately after cleaning.  Monitor yourself for signs and symptoms of illness Caregivers and household members are considered close contacts, should monitor their health, and will be asked to limit movement outside of the home to the extent possible. Follow the monitoring steps for close contacts listed on the symptom monitoring form.   ? If you have additional questions, contact your local health department or call the epidemiologist on call at [phone] (available 24/7). ? This guidance is subject to change. For the most up-to-date guidance from [facility], please refer to their website: YouBlogs.pl

## 2019-02-03 NOTE — ED Notes (Signed)
Patient verbalizes understanding of discharge instructions. Opportunity for questioning and answers were provided. Armband removed by staff, pt discharged from ED.  

## 2019-02-03 NOTE — ED Provider Notes (Addendum)
[facility]   CSN: [id number] Arrival date & time: 02/03/19  1600     History   Chief Complaint Chief Complaint  Patient presents with  . Shortness of Breath  . Headache  . Cough  . Diarrhea    HPI Grace SheffieldDana G Parker is a 33 y.o. female.     Patient is a 33 year old female with past medical history of asthma.  She presents with a several day history of cough, wheezing, headache, and loose stools.  She denies any chest pain.  She denies any contacts diagnosed with COVID-19.  She has run out of her inhaler.  The history is provided by the patient.  Shortness of Breath Severity:  Moderate Onset quality:  Gradual Duration:  3 days Timing:  Constant Progression:  Worsening Chronicity:  Recurrent Context: activity and URI   Relieved by:  Nothing Worsened by:  Nothing Ineffective treatments:  None tried Associated symptoms: cough and headaches   Headache Associated symptoms: cough and diarrhea   Cough Associated symptoms: headaches and shortness of breath   Diarrhea Associated symptoms: headaches     Past Medical History:  Diagnosis Date  . Asthma     There are no active problems to display for this patient.   Past Surgical History:  Procedure Laterality Date  . CHOLECYSTECTOMY    . HERNIA REPAIR       OB History   No obstetric history on file.      Home Medications    Prior to Admission medications   Medication Sig Start Date End Date Taking? Authorizing Provider  albuterol (PROVENTIL HFA;VENTOLIN HFA) 108 (90 BASE) MCG/ACT inhaler Inhale 2 puffs into the lungs every 4 (four) hours as needed for wheezing or shortness of breath. 07/05/14   Muthersbaugh, Dahlia ClientHannah, PA-C  albuterol (PROVENTIL) (2.5 MG/3ML) 0.083% nebulizer solution Take 3 mLs (2.5 mg total) by nebulization every 4 (four) hours as needed for wheezing or shortness of breath. 10/14/14   Hess, Nada Boozerobyn M, PA-C  ibuprofen (ADVIL,MOTRIN) 200 MG tablet  Take 400 mg by mouth every 6 (six) hours as needed for moderate pain (pain).     [provider]  predniSONE (DELTASONE) 20 MG tablet 2 tabs po daily x 4 days 10/14/14   Kathrynn SpeedHess, Robyn M, PA-C    Family History Family History  Problem Relation Age of Onset  . Diabetes Father   . Hypertension Father     Social History Social History   Tobacco Use  . Smoking status: Current Every Day Smoker    Packs/day: 0.15    Types: Cigarettes  Substance Use Topics  . Alcohol use: No  . Drug use: No     Allergies   Patient has no known allergies.   Review of Systems Review of Systems  Respiratory: Positive for cough and shortness of breath.   Gastrointestinal: Positive for diarrhea.  Neurological: Positive for headaches.  All other systems reviewed and are negative.    Physical Exam Updated Vital Signs BP 130/80 (BP Location: Right Arm)   Pulse (!) 107   Temp 98.3 F (36.8 C) (Oral)   Resp 18   LMP 01/27/2019   SpO2 94%   Physical Exam Vitals signs and nursing note reviewed.  Constitutional:      General: She is not in acute distress.    Appearance: She is well-developed. She is not diaphoretic.  HENT:     Head: Normocephalic and atraumatic.  Neck:     Musculoskeletal:  Normal range of motion and neck supple.  Cardiovascular:     Rate and Rhythm: Normal rate and regular rhythm.     Heart sounds: No murmur. No friction rub. No gallop.   Pulmonary:     Effort: Pulmonary effort is normal. No respiratory distress.     Breath sounds: Examination of the right-middle field reveals wheezing. Examination of the left-middle field reveals wheezing. Wheezing present.  Abdominal:     General: Bowel sounds are normal. There is no distension.     Palpations: Abdomen is soft.     Tenderness: There is no abdominal tenderness.  Musculoskeletal: Normal range of motion.  Skin:    General: Skin is warm and dry.  Neurological:     Mental Status: She is alert and oriented to  person, place, and time.      ED Treatments / Results  Labs (all labs ordered are listed, but only abnormal results are displayed) Labs Reviewed  CBC - Abnormal; Notable for the following components:      Result Value   WBC 14.4 (*)    Hemoglobin 15.8 (*)    HCT 46.5 (*)    All other components within normal limits  BASIC METABOLIC PANEL - Abnormal; Notable for the following components:   Potassium 3.4 (*)    Glucose, Bld 134 (*)    All other components within normal limits  NOVEL CORONAVIRUS, NAA (HOSPITAL ORDER, SEND-OUT TO REF LAB)    EKG None  Radiology No results found.  Procedures Procedures (including critical care time)  Medications Ordered in ED Medications  albuterol (VENTOLIN HFA) 108 (90 Base) MCG/ACT inhaler 1-2 puff (2 puffs Inhalation Given 02/03/19 1704)     Initial Impression / Assessment and Plan / ED Course  I have reviewed the triage vital signs and the nursing notes.  Pertinent labs & imaging results that were available during my care of the patient were reviewed by me and considered in my medical decision making (see chart for details).  Patient presenting with wheezing and cough.  She has a history of asthma and I suspect this is an exacerbation of this.  Also considered is the possibility of COVID-19.  She has no known contacts, but will have a send out test performed.  Patient is not hypoxic and appears comfortable.  I believe she is appropriate for discharge.  She will be treated with prednisone, given a refill of her inhaler, and is to follow-up as needed if she worsens.  ETHA STAMBAUGH was evaluated in Emergency Department on 02/03/2019 for the symptoms described in the history of present illness. She was evaluated in the context of the global COVID-19 pandemic, which necessitated consideration that the patient might be at risk for infection with the SARS-CoV-2 virus that causes COVID-19. Institutional protocols and algorithms that pertain to the  evaluation of patients at risk for COVID-19 are in a state of rapid change based on information released by regulatory bodies including the CDC and federal and state organizations. These policies and algorithms were followed during the patient's care in the ED.   Final Clinical Impressions(s) / ED Diagnoses   Final diagnoses:  None    ED Discharge Orders    None       Veryl Speak, MD 02/03/19 Judith Blonder    Veryl Speak, MD 02/03/19 1929

## 2019-02-03 NOTE — ED Triage Notes (Addendum)
Pt here for eval of shortness of breath, cough diarhea X 1, headache since Wednesday. Denies abdominal pain denies chest pain. Pt ran out of her inhaler this morning that she uses for her asthma.

## 2019-02-04 LAB — NOVEL CORONAVIRUS, NAA (HOSP ORDER, SEND-OUT TO REF LAB; TAT 18-24 HRS): SARS-CoV-2, NAA: NOT DETECTED

## 2019-04-12 DIAGNOSIS — I10 Essential (primary) hypertension: Secondary | ICD-10-CM

## 2019-04-12 DIAGNOSIS — R0902 Hypoxemia: Secondary | ICD-10-CM

## 2019-04-12 DIAGNOSIS — J45901 Unspecified asthma with (acute) exacerbation: Secondary | ICD-10-CM

## 2022-09-07 ENCOUNTER — Emergency Department
Admission: EM | Admit: 2022-09-07 | Discharge: 2022-09-07 | Disposition: A | Discharge status: Home / Self Care | Payer: Self-pay | Attending: Student in an Organized Health Care Education/Training Program | Admitting: Student in an Organized Health Care Education/Training Program

## 2022-09-07 ENCOUNTER — Encounter: Payer: Self-pay | Admitting: Intensive Care

## 2022-09-07 ENCOUNTER — Other Ambulatory Visit: Payer: Self-pay

## 2022-09-07 DIAGNOSIS — K047 Periapical abscess without sinus: Secondary | ICD-10-CM | POA: Insufficient documentation

## 2022-09-07 DIAGNOSIS — K029 Dental caries, unspecified: Secondary | ICD-10-CM | POA: Insufficient documentation

## 2022-09-07 MED ORDER — CHLORHEXIDINE GLUCONATE 0.12 % MT SOLN: 15.0000 mL | Freq: Two times a day (BID) | OROMUCOSAL | 0 refills | Status: AC

## 2022-09-07 MED ORDER — KETOROLAC TROMETHAMINE 15 MG/ML IJ SOLN
15.0000 mg | Freq: Once | INTRAMUSCULAR | Status: AC
  Administered 2022-09-07: 15 mg via INTRAMUSCULAR
  Filled 2022-09-07: qty 1

## 2022-09-07 MED ORDER — AMOXICILLIN-POT CLAVULANATE 875-125 MG PO TABS: 1.0000 | ORAL_TABLET | Freq: Two times a day (BID) | ORAL | 0 refills | Status: AC

## 2022-09-07 NOTE — Discharge Instructions (Signed)
Please take the antibiotics as prescribed.  Please follow-up with a dentist to soon as possible.  Please return for any new, worsening, or change in symptoms or other concerns.  It was a pleasure caring for you today.

## 2022-09-07 NOTE — ED Triage Notes (Signed)
Patient reports right sided face swelling/pain due to dental abscess X1 week. Pain became severe around 1am today

## 2022-09-07 NOTE — ED Provider Notes (Signed)
[facility] Provider Note    Event Date/Time   First MD Initiated Contact with Patient 09/07/22 0827     (approximate)   History   Dental Pain   HPI  IMO ALKHATIB is a 37 y.o. female with a past medical history of obesity and dental problems who presents today for evaluation of right lower molar pain.  She reports this has been ongoing for the past week.  She has not noticed any facial swelling or neck pain.  She denies any fevers or chills.  She reports that she has significant pain with any attempted eating on that side.  No fever or chills, no abdominal pain, chest pain, nausea, vomiting, diarrhea.  There are no problems to display for this patient.         Physical Exam   Triage Vital Signs: ED Triage Vitals  Enc Vitals Group     BP 09/07/22 0824 (!) 150/76     Pulse Rate 09/07/22 0824 (!) 107     Resp 09/07/22 0824 16     Temp 09/07/22 0822 98 F (36.7 C)     Temp Source 09/07/22 0822 Oral     SpO2 09/07/22 0824 99 %     Weight 09/07/22 0823 221 lb (100.2 kg)     Height 09/07/22 0823 5' 6"$  (1.676 m)     Head Circumference --      Peak Flow --      Pain Score 09/07/22 0823 8     Pain Loc --      Pain Edu? --      Excl. in [place]? --     Most recent vital signs: Vitals:   09/07/22 0822 09/07/22 0824  BP:  (!) 150/76  Pulse:  (!) 107  Resp:  16  Temp: 98 F (36.7 C)   SpO2:  99%    Physical Exam Vitals and nursing note reviewed.  Constitutional:      General: Awake and alert. No acute distress.    Appearance: Normal appearance. The patient is obese.  HENT:     Head: Normocephalic and atraumatic.     Mouth: Mucous membranes are moist.  Poor dentition diffusely.  Tooth #30 appears to be cracked with obvious dental decay.  No gingival fluctuance.  No trismus.  No drooling.  No facial or neck swelling or erythema.  Normal voice.  Normal sublingual space without sublingual swelling or woodiness Eyes:     General: PERRL. Normal EOMs         Right eye: No discharge.        Left eye: No discharge.     Conjunctiva/sclera: Conjunctivae normal.  Cardiovascular:     Rate and Rhythm: Normal rate and regular rhythm.     Pulses: Normal pulses.  Pulmonary:     Effort: Pulmonary effort is normal. No respiratory distress.     Breath sounds: Normal breath sounds.  Abdominal:     Abdomen is soft. There is no abdominal tenderness.  Musculoskeletal:        General: No swelling. Normal range of motion.     Cervical back: Normal range of motion and neck supple.  Skin:    General: Skin is warm and dry.     Capillary Refill: Capillary refill takes less than 2 seconds.     Findings: No rash.  Neurological:     Mental Status: The patient is awake and alert.      ED Results / Procedures /  Treatments   Labs (all labs ordered are listed, but only abnormal results are displayed) Labs Reviewed - No data to display   EKG     RADIOLOGY     PROCEDURES:  Critical Care performed:   Procedures   MEDICATIONS ORDERED IN ED: Medications  ketorolac (TORADOL) 15 MG/ML injection 15 mg (15 mg Intramuscular Given 09/07/22 0843)     IMPRESSION / MDM / [place] / ED COURSE  I reviewed the triage vital signs and the nursing notes.   Differential diagnosis includes, but is not limited to, dental caries, dental decay, pulpitis, abscess. Patient is awake and alert, nontoxic in appearance.  Patient has tenderness over 1 of her teeth and poor dentition, I suspect some dental caries vs decay vs pulpitits. No gingival swelling or fluctuance concerning for gingival abscess.  No trismus, nuchal rigidity, neck pain, hot potato voice, uvular deviation or malocclusion to suggest deep space infection. No sublingual swelling concerning for Ludwig's angina.  Patient was started on antibiotics and chlorhexidine mouth rinse.  Patient was treated symptomatically in the emergency department. Discussed care plan, return precautions, and  advised close outpatient follow-up with dentist. Patient agrees with plan of care.   Patient's presentation is most consistent with acute complicated illness / injury requiring diagnostic workup.      FINAL CLINICAL IMPRESSION(S) / ED DIAGNOSES   Final diagnoses:  Dental caries  Dental infection     Rx / DC Orders   ED Discharge Orders          Ordered    amoxicillin-clavulanate (AUGMENTIN) 875-125 MG tablet  2 times daily        09/07/22 0835    chlorhexidine (PERIDEX) 0.12 % solution  2 times daily        09/07/22 [id number]             Note:  This document was prepared using Dragon voice recognition software and may include unintentional dictation errors.   Emeline Gins 09/07/22 0914    Merlyn Lot, MD 09/07/22 1139
